# Patient Record
Sex: Female | Born: 1935 | Race: Black or African American | Hispanic: No | Smoking: Former smoker
Health system: Southern US, Community
[De-identification: ages and names within clinical notes are randomized; demographics above are authoritative.]

## PROBLEM LIST (undated history)

## (undated) DIAGNOSIS — H9110 Presbycusis, unspecified ear: Secondary | ICD-10-CM

## (undated) DIAGNOSIS — C3490 Malignant neoplasm of unspecified part of unspecified bronchus or lung: Secondary | ICD-10-CM

## (undated) DIAGNOSIS — E785 Hyperlipidemia, unspecified: Secondary | ICD-10-CM

## (undated) DIAGNOSIS — E669 Obesity, unspecified: Secondary | ICD-10-CM

## (undated) DIAGNOSIS — I219 Acute myocardial infarction, unspecified: Secondary | ICD-10-CM

## (undated) DIAGNOSIS — I10 Essential (primary) hypertension: Secondary | ICD-10-CM

## (undated) DIAGNOSIS — R0602 Shortness of breath: Secondary | ICD-10-CM

## (undated) DIAGNOSIS — C801 Malignant (primary) neoplasm, unspecified: Secondary | ICD-10-CM

## (undated) DIAGNOSIS — M1990 Unspecified osteoarthritis, unspecified site: Secondary | ICD-10-CM

## (undated) DIAGNOSIS — E039 Hypothyroidism, unspecified: Secondary | ICD-10-CM

## (undated) DIAGNOSIS — D649 Anemia, unspecified: Secondary | ICD-10-CM

## (undated) DIAGNOSIS — K219 Gastro-esophageal reflux disease without esophagitis: Secondary | ICD-10-CM

## (undated) DIAGNOSIS — I2510 Atherosclerotic heart disease of native coronary artery without angina pectoris: Secondary | ICD-10-CM

## (undated) DIAGNOSIS — G4700 Insomnia, unspecified: Secondary | ICD-10-CM

## (undated) DIAGNOSIS — R739 Hyperglycemia, unspecified: Secondary | ICD-10-CM

## (undated) HISTORY — PX: KNEE ARTHROPLASTY: SHX992

## (undated) HISTORY — PX: CORONARY STENT PLACEMENT: SHX1402

## (undated) HISTORY — PX: CHOLECYSTECTOMY: SHX55

---

## 2005-08-06 ENCOUNTER — Encounter: Payer: Self-pay | Admitting: Pulmonary Disease

## 2006-02-18 ENCOUNTER — Ambulatory Visit: Payer: Self-pay | Admitting: Pulmonary Disease

## 2006-02-25 ENCOUNTER — Ambulatory Visit: Payer: Self-pay | Admitting: Pulmonary Disease

## 2006-04-08 ENCOUNTER — Ambulatory Visit: Payer: Self-pay | Admitting: Pulmonary Disease

## 2006-04-12 ENCOUNTER — Ambulatory Visit (HOSPITAL_COMMUNITY): Admission: RE | Admit: 2006-04-12 | Discharge: 2006-04-12 | Payer: Self-pay | Admitting: Pulmonary Disease

## 2006-04-12 ENCOUNTER — Encounter: Payer: Self-pay | Admitting: Pulmonary Disease

## 2006-04-21 ENCOUNTER — Encounter: Payer: Self-pay | Admitting: Pulmonary Disease

## 2007-02-27 ENCOUNTER — Ambulatory Visit: Payer: Self-pay | Admitting: Pulmonary Disease

## 2007-02-27 LAB — CONVERTED CEMR LAB
CO2: 32 meq/L (ref 19–32)
Calcium: 9.6 mg/dL (ref 8.4–10.5)
Creatinine, Ser: 0.9 mg/dL (ref 0.4–1.2)
Glucose, Bld: 108 mg/dL — ABNORMAL HIGH (ref 70–99)
Sodium: 140 meq/L (ref 135–145)

## 2007-03-03 ENCOUNTER — Encounter: Payer: Self-pay | Admitting: Pulmonary Disease

## 2007-03-03 ENCOUNTER — Ambulatory Visit: Payer: Self-pay | Admitting: Cardiovascular Disease

## 2007-10-01 DIAGNOSIS — I10 Essential (primary) hypertension: Secondary | ICD-10-CM

## 2007-10-01 DIAGNOSIS — E669 Obesity, unspecified: Secondary | ICD-10-CM

## 2007-10-01 DIAGNOSIS — J984 Other disorders of lung: Secondary | ICD-10-CM

## 2007-10-01 HISTORY — DX: Obesity, unspecified: E66.9

## 2007-10-01 HISTORY — DX: Essential (primary) hypertension: I10

## 2007-10-24 ENCOUNTER — Emergency Department (HOSPITAL_COMMUNITY): Admission: EM | Admit: 2007-10-24 | Discharge: 2007-10-24 | Payer: Self-pay | Admitting: Emergency Medicine

## 2008-08-27 ENCOUNTER — Ambulatory Visit: Payer: Self-pay | Admitting: Vascular Surgery

## 2008-09-28 ENCOUNTER — Ambulatory Visit: Payer: Self-pay | Admitting: Vascular Surgery

## 2009-03-28 ENCOUNTER — Encounter: Payer: Self-pay | Admitting: Pulmonary Disease

## 2009-03-28 ENCOUNTER — Encounter: Admission: RE | Admit: 2009-03-28 | Discharge: 2009-03-28 | Payer: Self-pay | Admitting: *Deleted

## 2009-04-26 DIAGNOSIS — M1990 Unspecified osteoarthritis, unspecified site: Secondary | ICD-10-CM

## 2009-04-26 DIAGNOSIS — E785 Hyperlipidemia, unspecified: Secondary | ICD-10-CM

## 2009-04-26 DIAGNOSIS — E039 Hypothyroidism, unspecified: Secondary | ICD-10-CM | POA: Insufficient documentation

## 2009-04-26 DIAGNOSIS — I10 Essential (primary) hypertension: Secondary | ICD-10-CM | POA: Insufficient documentation

## 2009-04-26 HISTORY — DX: Hyperlipidemia, unspecified: E78.5

## 2009-04-26 HISTORY — DX: Hypothyroidism, unspecified: E03.9

## 2009-04-26 HISTORY — DX: Unspecified osteoarthritis, unspecified site: M19.90

## 2009-04-27 ENCOUNTER — Ambulatory Visit: Payer: Self-pay | Admitting: Pulmonary Disease

## 2009-05-10 ENCOUNTER — Encounter (INDEPENDENT_AMBULATORY_CARE_PROVIDER_SITE_OTHER): Payer: Self-pay | Admitting: Interventional Radiology

## 2009-05-10 ENCOUNTER — Ambulatory Visit (HOSPITAL_COMMUNITY): Admission: RE | Admit: 2009-05-10 | Discharge: 2009-05-10 | Payer: Self-pay | Admitting: Pulmonary Disease

## 2009-05-10 ENCOUNTER — Encounter: Payer: Self-pay | Admitting: Pulmonary Disease

## 2009-05-18 ENCOUNTER — Telehealth (INDEPENDENT_AMBULATORY_CARE_PROVIDER_SITE_OTHER): Payer: Self-pay | Admitting: *Deleted

## 2009-05-20 ENCOUNTER — Ambulatory Visit: Payer: Self-pay | Admitting: Pulmonary Disease

## 2009-05-20 ENCOUNTER — Telehealth: Payer: Self-pay | Admitting: Pulmonary Disease

## 2009-05-20 DIAGNOSIS — C3490 Malignant neoplasm of unspecified part of unspecified bronchus or lung: Secondary | ICD-10-CM | POA: Insufficient documentation

## 2009-05-20 HISTORY — DX: Malignant neoplasm of unspecified part of unspecified bronchus or lung: C34.90

## 2009-05-23 ENCOUNTER — Encounter: Payer: Self-pay | Admitting: Pulmonary Disease

## 2009-05-25 ENCOUNTER — Ambulatory Visit: Payer: Self-pay | Admitting: Internal Medicine

## 2009-05-25 ENCOUNTER — Ambulatory Visit (HOSPITAL_COMMUNITY): Admission: RE | Admit: 2009-05-25 | Discharge: 2009-05-25 | Payer: Self-pay | Admitting: Pulmonary Disease

## 2009-05-26 ENCOUNTER — Ambulatory Visit: Payer: Self-pay | Admitting: Internal Medicine

## 2009-06-02 ENCOUNTER — Ambulatory Visit (HOSPITAL_COMMUNITY): Admission: RE | Admit: 2009-06-02 | Discharge: 2009-06-02 | Payer: Self-pay | Admitting: Internal Medicine

## 2009-06-13 LAB — CBC WITH DIFFERENTIAL/PLATELET
BASO%: 0.7 % (ref 0.0–2.0)
Basophils Absolute: 0 10e3/uL (ref 0.0–0.1)
EOS%: 3.4 % (ref 0.0–7.0)
Eosinophils Absolute: 0.2 10e3/uL (ref 0.0–0.5)
HCT: 35.7 % (ref 34.8–46.6)
HGB: 11.7 g/dL (ref 11.6–15.9)
LYMPH%: 32.1 % (ref 14.0–49.7)
MCH: 28.1 pg (ref 25.1–34.0)
MCHC: 32.8 g/dL (ref 31.5–36.0)
MCV: 85.6 fL (ref 79.5–101.0)
MONO#: 0.4 10e3/uL (ref 0.1–0.9)
MONO%: 8.8 % (ref 0.0–14.0)
NEUT#: 2.8 10e3/uL (ref 1.5–6.5)
NEUT%: 55 % (ref 38.4–76.8)
Platelets: 232 10e3/uL (ref 145–400)
RBC: 4.17 10e6/uL (ref 3.70–5.45)
RDW: 15.6 % — ABNORMAL HIGH (ref 11.2–14.5)
WBC: 5.1 10e3/uL (ref 3.9–10.3)
lymph#: 1.6 10e3/uL (ref 0.9–3.3)

## 2009-06-13 LAB — COMPREHENSIVE METABOLIC PANEL
ALT: 18 U/L (ref 0–35)
AST: 17 U/L (ref 0–37)
Alkaline Phosphatase: 95 U/L (ref 39–117)
Calcium: 9.1 mg/dL (ref 8.4–10.5)
Total Bilirubin: 0.4 mg/dL (ref 0.3–1.2)

## 2009-09-07 ENCOUNTER — Ambulatory Visit: Payer: Self-pay | Admitting: Internal Medicine

## 2009-09-12 ENCOUNTER — Ambulatory Visit (HOSPITAL_COMMUNITY): Admission: RE | Admit: 2009-09-12 | Discharge: 2009-09-12 | Payer: Self-pay | Admitting: Internal Medicine

## 2009-09-12 LAB — CBC WITH DIFFERENTIAL/PLATELET
BASO%: 0.3 % (ref 0.0–2.0)
Basophils Absolute: 0 10*3/uL (ref 0.0–0.1)
EOS%: 1.7 % (ref 0.0–7.0)
Eosinophils Absolute: 0.1 10*3/uL (ref 0.0–0.5)
HCT: 37.2 % (ref 34.8–46.6)
HGB: 12.3 g/dL (ref 11.6–15.9)
LYMPH%: 16.4 % (ref 14.0–49.7)
MCH: 28.5 pg (ref 25.1–34.0)
MCHC: 33.1 g/dL (ref 31.5–36.0)
MCV: 86.3 fL (ref 79.5–101.0)
MONO#: 0.7 10*3/uL (ref 0.1–0.9)
MONO%: 8.8 % (ref 0.0–14.0)
NEUT#: 6 10*3/uL (ref 1.5–6.5)
NEUT%: 72.8 % (ref 38.4–76.8)
Platelets: 245 10*3/uL (ref 145–400)
RBC: 4.31 10*6/uL (ref 3.70–5.45)
RDW: 14.7 % — ABNORMAL HIGH (ref 11.2–14.5)
WBC: 8.3 10*3/uL (ref 3.9–10.3)
lymph#: 1.4 10*3/uL (ref 0.9–3.3)

## 2009-09-12 LAB — COMPREHENSIVE METABOLIC PANEL
AST: 21 U/L (ref 0–37)
Alkaline Phosphatase: 100 U/L (ref 39–117)
BUN: 23 mg/dL (ref 6–23)
Chloride: 107 mEq/L (ref 96–112)
Creatinine, Ser: 1.1 mg/dL (ref 0.40–1.20)
Glucose, Bld: 109 mg/dL — ABNORMAL HIGH (ref 70–99)
Potassium: 3 mEq/L — ABNORMAL LOW (ref 3.5–5.3)
Total Protein: 7.9 g/dL (ref 6.0–8.3)

## 2011-03-04 LAB — CREATININE, SERUM
GFR calc Af Amer: 45 mL/min — ABNORMAL LOW (ref >60)
GFR calc non Af Amer: 37 mL/min — ABNORMAL LOW (ref >60)

## 2011-03-05 LAB — CBC
MCHC: 33.6 g/dL (ref 30.0–36.0)
RDW: 14.5 % (ref 11.5–15.5)
WBC: 5.3 10*3/uL (ref 4.0–10.5)

## 2011-03-05 LAB — GLUCOSE, CAPILLARY: Glucose-Capillary: 98 mg/dL (ref 70–99)

## 2011-03-05 LAB — APTT: aPTT: 34 seconds (ref 24–37)

## 2011-04-10 NOTE — Consult Note (Signed)
VASCULAR SURGERY CONSULTATION   Shannon Camacho, Shannon Camacho  DOB:  September 06, 1928                                       09/28/2008  UEAVW#:[id number]   The patient was referred by Dr. Wynelle Cleveland for vascular surgery  evaluation of her lower extremities.  She is a 77 year old female who  complains of occasional discomfort in her heels with the right worse  than left.  She has had no history of nonhealing ulcers, infection and  denies any numbness in the feet.  She does not ambulate long distances  because of dyspnea on exertion.  She is not limited in her walking by  leg discomfort.  She has had a previous knee replacement which limits  her somewhat.   PAST MEDICAL HISTORY:  1. Hypertension.  2. History of a right lung mass - etiology unknown.  3. Negative for coronary artery disease, COPD, stroke or      hyperlipidemia.   PAST SURGICAL HISTORY:  1. Right knee replacement.  2. Cholecystectomy.  3. Tubal ligation.   FAMILY HISTORY:  Positive for myocardial infarction in her mother.  Negative for diabetes and stroke.  Her brother died of an unknown  cancer.   SOCIAL HISTORY:  The patient is widowed and retired.  She has not smoked  in close to 30 years.  Does not use alcohol.   REVIEW OF SYSTEMS:  Is positive for a heart murmur.  She denies any  chest pain but does have dyspnea on exertion.  Has occasional headaches,  arthritis, joint pain and muscle pain.   ALLERGIES:  None known.   MEDICATIONS:  Please see health history form.   PHYSICAL EXAMINATION:  Vital signs:  Blood pressure is 127/79, heart  rate is 92, respirations 22.  General:  She is an elderly female in no  apparent distress.  Alert and oriented x3.  Neck:  Supple, 3+ carotid  pulses palpable.  No bruits are audible.  Neurological:  Normal.  No  palpable adenopathy in the neck.  Chest:  Clear to auscultation.  Cardiovascular:  Regular rhythm.  No murmurs.  Abdomen:  Obese.  No  palpable masses.  She has 2+  femoral and 2+ popliteal pulses  bilaterally.  The left leg has a 2+ dorsalis pedis pulse, right leg a 1+  posterior tibial pulse.  Both feet are well-perfused without evidence of  ischemia.   Vascular lab studies in our office today revealed an ABI of 0.80 on the  right, 1.0 on the left, biphasic flow in the right posterior tibial and  triphasic flow in the left dorsalis pedis.   Her circulation is not diminished enough to be causing rest pain in her  feet and I do not think that the pain in her heels is vascular in  origin.  It may well be orthopedic type pain that is causing her  discomfort.  I do not think any further vascular workup is indicated at  this time.   Shannon Camacho, M.D.  Electronically Signed  JDL/MEDQ  D:  09/28/2008  T:  09/29/2008  Job:  [id number]   cc:   Tinnie Gens A. Petrinitz, D.P.M.

## 2012-12-03 ENCOUNTER — Encounter: Payer: Self-pay | Admitting: Family Medicine

## 2012-12-03 ENCOUNTER — Ambulatory Visit (INDEPENDENT_AMBULATORY_CARE_PROVIDER_SITE_OTHER): Payer: PRIVATE HEALTH INSURANCE | Admitting: Family Medicine

## 2012-12-03 VITALS — BP 124/75 | HR 67 | Temp 98.5°F | Ht 62.0 in | Wt 226.9 lb

## 2012-12-03 DIAGNOSIS — R7309 Other abnormal glucose: Secondary | ICD-10-CM

## 2012-12-03 DIAGNOSIS — G4700 Insomnia, unspecified: Secondary | ICD-10-CM | POA: Insufficient documentation

## 2012-12-03 DIAGNOSIS — M1990 Unspecified osteoarthritis, unspecified site: Secondary | ICD-10-CM

## 2012-12-03 DIAGNOSIS — E785 Hyperlipidemia, unspecified: Secondary | ICD-10-CM

## 2012-12-03 DIAGNOSIS — I2510 Atherosclerotic heart disease of native coronary artery without angina pectoris: Secondary | ICD-10-CM

## 2012-12-03 DIAGNOSIS — R739 Hyperglycemia, unspecified: Secondary | ICD-10-CM | POA: Insufficient documentation

## 2012-12-03 DIAGNOSIS — I10 Essential (primary) hypertension: Secondary | ICD-10-CM

## 2012-12-03 DIAGNOSIS — E039 Hypothyroidism, unspecified: Secondary | ICD-10-CM

## 2012-12-03 DIAGNOSIS — C3490 Malignant neoplasm of unspecified part of unspecified bronchus or lung: Secondary | ICD-10-CM

## 2012-12-03 HISTORY — DX: Atherosclerotic heart disease of native coronary artery without angina pectoris: I25.10

## 2012-12-03 HISTORY — DX: Insomnia, unspecified: G47.00

## 2012-12-03 HISTORY — DX: Hyperglycemia, unspecified: R73.9

## 2012-12-03 LAB — CBC
Platelets: 273 10*3/uL (ref 150–400)
RBC: 4.14 MIL/uL (ref 3.87–5.11)
RDW: 15 % (ref 11.5–15.5)
WBC: 5.2 10*3/uL (ref 4.0–10.5)

## 2012-12-03 MED ORDER — TRAZODONE HCL 50 MG PO TABS: 25.0000 mg | ORAL_TABLET | Freq: Every evening | ORAL | Status: DC | PRN

## 2012-12-03 MED ORDER — DICLOFENAC SODIUM 1 % TD GEL: 4.0000 g | Freq: Four times a day (QID) | TRANSDERMAL | Status: DC

## 2012-12-03 NOTE — Assessment & Plan Note (Signed)
Refill voltaren gel

## 2012-12-03 NOTE — Patient Instructions (Addendum)
Please use tylenol and tramadol for pain rather than the ibuprofen. I sent in a new prescription for insomnia I will call with blood work results. See me in 2-3 months to keep getting to know each other.

## 2012-12-04 LAB — COMPLETE METABOLIC PANEL WITH GFR
Albumin: 4.1 g/dL (ref 3.5–5.2)
BUN: 21 mg/dL (ref 6–23)
Calcium: 9.1 mg/dL (ref 8.4–10.5)
Chloride: 109 mEq/L (ref 96–112)
Creat: 0.91 mg/dL (ref 0.50–1.10)
GFR, Est African American: 68 mL/min
GFR, Est Non African American: 59 mL/min — ABNORMAL LOW
Glucose, Bld: 77 mg/dL (ref 70–99)
Potassium: 3.8 mEq/L (ref 3.5–5.3)

## 2012-12-05 ENCOUNTER — Telehealth: Payer: Self-pay | Admitting: Family Medicine

## 2012-12-05 MED ORDER — LEVOTHYROXINE SODIUM 88 MCG PO TABS: 88.0000 ug | ORAL_TABLET | Freq: Every day | ORAL | Status: DC

## 2012-12-05 NOTE — Assessment & Plan Note (Signed)
Previously treated with Alprazolam.  Will try trazodone.

## 2012-12-05 NOTE — Progress Notes (Signed)
  Subjective:    Patient ID: Shannon Camacho, female    DOB: 13-Apr-1928, 79 y.o.   MRN: [id number]  HPI  Doing well.  Has moved back to area and is establishing care.  No symptomatic complaints.  Most issues have to do with establishing referrals, refilling meds.    Review of Systems Denies CP, SOB, Cough, bleeding, change in bowel, bladder or wt     Objective:   Physical Exam Lungs clear Cardiac RRR with 1/6 SEM Abd benign Ext trace edema.       Assessment & Plan:

## 2012-12-05 NOTE — Assessment & Plan Note (Signed)
Had recent lipids in June which were OK on current meds

## 2012-12-05 NOTE — Assessment & Plan Note (Signed)
Well controled. 

## 2012-12-05 NOTE — Assessment & Plan Note (Signed)
Check TSH 

## 2012-12-05 NOTE — Assessment & Plan Note (Signed)
Referral back to Dr. Arville Go for ongoing lung cancer treatment surveillance.

## 2012-12-05 NOTE — Telephone Encounter (Signed)
Pt is needing referral to go see Dr Arbutus Ped [facility]

## 2012-12-10 NOTE — Telephone Encounter (Signed)
Referral has been placed in [facility] work queue.  They will call patient to schedule appointment.  Spoke with Daughter on Monday and informed her of this.  Ileana Ladd

## 2012-12-10 NOTE — Telephone Encounter (Signed)
Referral should be in the system.  Will route to our referral coordinator, Terese Door.

## 2012-12-11 ENCOUNTER — Telehealth: Payer: Self-pay | Admitting: Internal Medicine

## 2012-12-11 NOTE — Telephone Encounter (Signed)
S/W pt dtr in re NP appt 01/20 @ 1:30 w/Dr. Arbutus Ped Referring Dr. Doralee Albino Dx-Lung CA Welcome packet registration

## 2012-12-15 ENCOUNTER — Encounter: Payer: Self-pay | Admitting: Internal Medicine

## 2012-12-15 ENCOUNTER — Other Ambulatory Visit (HOSPITAL_BASED_OUTPATIENT_CLINIC_OR_DEPARTMENT_OTHER): Payer: PRIVATE HEALTH INSURANCE | Admitting: Lab

## 2012-12-15 ENCOUNTER — Ambulatory Visit: Payer: PRIVATE HEALTH INSURANCE

## 2012-12-15 ENCOUNTER — Telehealth: Payer: Self-pay | Admitting: Internal Medicine

## 2012-12-15 ENCOUNTER — Ambulatory Visit (HOSPITAL_BASED_OUTPATIENT_CLINIC_OR_DEPARTMENT_OTHER): Payer: Medicaid Other | Admitting: Internal Medicine

## 2012-12-15 VITALS — BP 142/79 | HR 62 | Temp 97.3°F | Resp 18 | Ht 62.0 in | Wt 223.3 lb

## 2012-12-15 DIAGNOSIS — C3410 Malignant neoplasm of upper lobe, unspecified bronchus or lung: Secondary | ICD-10-CM

## 2012-12-15 DIAGNOSIS — C3490 Malignant neoplasm of unspecified part of unspecified bronchus or lung: Secondary | ICD-10-CM

## 2012-12-15 LAB — CBC WITH DIFFERENTIAL/PLATELET
BASO%: 1 % (ref 0.0–2.0)
EOS%: 5.6 % (ref 0.0–7.0)
LYMPH%: 31.1 % (ref 14.0–49.7)
MCH: 28.4 pg (ref 25.1–34.0)
MCHC: 32.4 g/dL (ref 31.5–36.0)
MONO#: 0.5 10*3/uL (ref 0.1–0.9)
Platelets: 243 10*3/uL (ref 145–400)
RBC: 4.2 10*6/uL (ref 3.70–5.45)
WBC: 4.8 10*3/uL (ref 3.9–10.3)
lymph#: 1.5 10*3/uL (ref 0.9–3.3)

## 2012-12-15 LAB — COMPREHENSIVE METABOLIC PANEL (CC13)
ALT: 13 U/L (ref 0–55)
AST: 15 U/L (ref 5–34)
CO2: 28 mEq/L (ref 22–29)
Creatinine: 1.1 mg/dL (ref 0.6–1.1)
Sodium: 141 mEq/L (ref 136–145)
Total Bilirubin: 0.42 mg/dL (ref 0.20–1.20)
Total Protein: 7.8 g/dL (ref 6.4–8.3)

## 2012-12-15 NOTE — Progress Notes (Signed)
Checked in new patient. No financial issues. °

## 2012-12-15 NOTE — Progress Notes (Signed)
[facility] [phone]   [phone]  CONSULT NOTE  REFERRING PHYSICIAN: Dr. Larene Beach.  REASON FOR CONSULTATION:  79 years old [place] American female with lung cancer.  HPI Shannon Camacho is a 79 y.o. female was past medical history significant for hypertension, coronary artery disease status post 5 stent placement in 2011 as well as remote history of smoking. The patient was diagnosed with stage IIIB/4 non-small cell lung cancer, adenocarcinoma with negative EGFR mutation in June of 2010. She was considered at that time for treatment with chemotherapy in the form of carboplatin and Alimta versus palliative care. The patient decided at that time to move to [place] to be close to other family members in that area. She was seen by medical oncology at the [facility]. She was followed by observation for more than 2 years as the patient refused any form of treatment at that time. She had molecular profiling of her tumor that showed positive BRAF mutation (V600E). She was considered for treatment with zelboraf. She declined the treatment initially but in June of 2013 she decided to proceed with the treatment after she was found to have significant evidence for disease progression. She took less than 2 weeks of treatment and this was discontinued secondary to intolerance and adverse effect including skin rash and hand-foot syndrome. Her last CT scan of the chest was performed on 05/05/2012 and it showed interval progression of disease as manifested by coalescence of tissue previously described right upper lobe masses and to a single mass which measured greater than the some of the 2 previously seen masses and it measured 8.8 x 3.3 CM, at least a modest increase in size of several other nodules and to a lesser degree of increase in the conspicuity of smaller nodules. No change in the mediastinal adenopathy. The patient decided to return back to  [place] to be close to her daughter Shannon Camacho. She came today for reevaluation and to reestablish care with me. During her stay in [place] the patient was diagnosed with coronary artery disease and she had 5 stents placed. She is feeling fine today except for mild fatigue and shortness breath with exertion. She denied having any significant weight loss or night sweats. She has no chest pain, cough or hemoptysis.  PAST MEDICAL HISTORY: 1) hypertension 2) Coronary artery disease status post 5 stent placement 3) history of heart murmur 4) status post cholecystectomy 5) status post tubal ligation 6) status post right knee replacement  FAMILY HISTORY: Brother had cancer of unknown type, father had peripheral vascular disease and mother with myocardial infarction.   SOCIAL HISTORY: The patient is a widow and has 5 children, 4 living. She has a history of smoking less than one pack per day for around 25 years and quit more than 30 years ago. No history of alcohol or drug abuse. She was accompanied by her daughter Shannon Camacho today.  No Known Allergies  Current Outpatient Prescriptions  Medication Sig Dispense Refill  . aspirin 81 MG tablet Take 81 mg by mouth daily.      . ciclopirox (LOPROX) 0.77 % cream Apply 1 application topically daily. Uses for feet.      . diclofenac sodium (VOLTAREN) 1 % GEL Apply 4 g topically 4 (four) times daily. Uses on knees  100 g  12  . hydrochlorothiazide (MICROZIDE) 12.5 MG capsule Take 12.5 mg by mouth daily.      Marland Kitchen levothyroxine (SYNTHROID, LEVOTHROID) 88 MCG tablet  Take 1 tablet (88 mcg total) by mouth daily.  90 tablet  3  . metoprolol tartrate (LOPRESSOR) 25 MG tablet Take 25 mg by mouth 2 (two) times daily.      . pravastatin (PRAVACHOL) 40 MG tablet Take 40 mg by mouth daily. Take 1 1/2 tablet by mouth once daily      . traMADol (ULTRAM) 50 MG tablet Take 50 mg by mouth every 6 (six) hours as needed.      . traZODone (DESYREL) 50 MG tablet Take 0.5-1  tablets (25-50 mg total) by mouth at bedtime as needed for sleep.  30 tablet  3    Review of Systems  A comprehensive review of systems was negative except for: Constitutional: positive for fatigue Respiratory: positive for dyspnea on exertion  Physical Exam  HQI:ONGEX, healthy, no distress, well nourished and well developed SKIN: skin color, texture, turgor are normal HEAD: Normocephalic, No masses, lesions, tenderness or abnormalities EYES: normal EARS: External ears normal OROPHARYNX:no exudate and no erythema  NECK: supple, no adenopathy LYMPH:  no palpable lymphadenopathy, no hepatosplenomegaly BREAST:not examined LUNGS: clear to auscultation  HEART: regular rate & rhythm, no murmurs and no gallops ABDOMEN:abdomen soft, non-tender, obese, normal bowel sounds and no masses or organomegaly BACK: Back symmetric, no curvature. EXTREMITIES:no joint deformities, effusion, or inflammation, no edema, no skin discoloration, no clubbing  NEURO: alert & oriented x 3 with fluent speech, no focal motor/sensory deficits  PERFORMANCE STATUS: ECOG 1  LABORATORY DATA: Lab Results  Component Value Date   WBC 4.8 12/15/2012   HGB 11.9 12/15/2012   HCT 36.7 12/15/2012   MCV 87.5 12/15/2012   PLT 243 12/15/2012      Chemistry      Component Value Date/Time   NA 141 12/15/2012 1328   NA 143 12/03/2012 1653   K 3.8 12/15/2012 1328   K 3.8 12/03/2012 1653   CL 104 12/15/2012 1328   CL 109 12/03/2012 1653   CO2 28 12/15/2012 1328   CO2 27 12/03/2012 1653   BUN 17.0 12/15/2012 1328   BUN 21 12/03/2012 1653   CREATININE 1.1 12/15/2012 1328   CREATININE 0.91 12/03/2012 1653   CREATININE 1.10 09/12/2009 1316      Component Value Date/Time   CALCIUM 9.3 12/15/2012 1328   CALCIUM 9.1 12/03/2012 1653   ALKPHOS 108 12/15/2012 1328   ALKPHOS 86 12/03/2012 1653   AST 15 12/15/2012 1328   AST 20 12/03/2012 1653   ALT 13 12/15/2012 1328   ALT 15 12/03/2012 1653   BILITOT 0.42 12/15/2012 1328   BILITOT 0.3 12/03/2012 1653        RADIOGRAPHIC STUDIES: No results found.  ASSESSMENT: This is a very pleasant 79 years old African American female diagnosed with metastatic non-small cell lung cancer, adenocarcinoma with negative EGFR mutation but positive BRAF (V600E) mutation. The patient did not receive any previous treatment except for less than 2 weeks with zelboraf discontinued secondary to intolerance in July of 2013.   PLAN: I have a lengthy discussion with the patient and her daughter today about her condition. Her last scan was performed in June of 2013. I recommended for the patient to have repeat CT scan of the head, chest, abdomen and pelvis for restaging of her disease. I will try to get a copy of the previous scan done in [place] for comparison. I would see the patient back for followup visit in 2 weeks for evaluation and discussion of her scan results and treatment options.  She was advised to call me immediately if she has any concerning symptoms in the interval.  All questions were answered. The patient knows to call the clinic with any problems, questions or concerns. We can certainly see the patient much sooner if necessary.  Thank you so much for allowing me to participate in the care of Shannon Camacho. I will continue to follow up the patient with you and assist in her care.  I spent 30 minutes counseling the patient face to face. The total time spent in the appointment was 55 minutes.   Rashunda Passon K. 12/15/2012, 2:40 PM

## 2012-12-15 NOTE — Telephone Encounter (Signed)
appt made and printed for pt aom °

## 2012-12-15 NOTE — Patient Instructions (Signed)
You have progressive lung cancer. We will repeat staging workup. Followup in 2 weeks

## 2012-12-25 ENCOUNTER — Ambulatory Visit (HOSPITAL_COMMUNITY)
Admission: RE | Admit: 2012-12-25 | Discharge: 2012-12-25 | Disposition: A | Discharge status: Home / Self Care | Payer: PRIVATE HEALTH INSURANCE | Source: Ambulatory Visit | Attending: Internal Medicine | Admitting: Internal Medicine

## 2012-12-25 ENCOUNTER — Encounter (HOSPITAL_COMMUNITY): Payer: Self-pay

## 2012-12-25 DIAGNOSIS — R05 Cough: Secondary | ICD-10-CM | POA: Insufficient documentation

## 2012-12-25 DIAGNOSIS — G319 Degenerative disease of nervous system, unspecified: Secondary | ICD-10-CM | POA: Insufficient documentation

## 2012-12-25 DIAGNOSIS — R222 Localized swelling, mass and lump, trunk: Secondary | ICD-10-CM | POA: Insufficient documentation

## 2012-12-25 DIAGNOSIS — R059 Cough, unspecified: Secondary | ICD-10-CM | POA: Insufficient documentation

## 2012-12-25 DIAGNOSIS — E279 Disorder of adrenal gland, unspecified: Secondary | ICD-10-CM | POA: Insufficient documentation

## 2012-12-25 DIAGNOSIS — I6529 Occlusion and stenosis of unspecified carotid artery: Secondary | ICD-10-CM | POA: Insufficient documentation

## 2012-12-25 DIAGNOSIS — N289 Disorder of kidney and ureter, unspecified: Secondary | ICD-10-CM | POA: Insufficient documentation

## 2012-12-25 DIAGNOSIS — C3490 Malignant neoplasm of unspecified part of unspecified bronchus or lung: Secondary | ICD-10-CM | POA: Insufficient documentation

## 2012-12-25 DIAGNOSIS — I658 Occlusion and stenosis of other precerebral arteries: Secondary | ICD-10-CM | POA: Insufficient documentation

## 2012-12-25 DIAGNOSIS — Z9089 Acquired absence of other organs: Secondary | ICD-10-CM | POA: Insufficient documentation

## 2012-12-25 DIAGNOSIS — R599 Enlarged lymph nodes, unspecified: Secondary | ICD-10-CM | POA: Insufficient documentation

## 2012-12-25 DIAGNOSIS — K429 Umbilical hernia without obstruction or gangrene: Secondary | ICD-10-CM | POA: Insufficient documentation

## 2012-12-25 HISTORY — DX: Malignant (primary) neoplasm, unspecified: C80.1

## 2012-12-25 HISTORY — DX: Essential (primary) hypertension: I10

## 2012-12-25 MED ORDER — IOHEXOL 300 MG/ML  SOLN
100.0000 mL | Freq: Once | INTRAMUSCULAR | Status: AC | PRN
  Administered 2012-12-25: 100 mL via INTRAVENOUS

## 2012-12-29 ENCOUNTER — Other Ambulatory Visit: Payer: Self-pay | Admitting: Lab

## 2012-12-29 ENCOUNTER — Encounter: Payer: Self-pay | Admitting: Internal Medicine

## 2012-12-29 ENCOUNTER — Other Ambulatory Visit (HOSPITAL_BASED_OUTPATIENT_CLINIC_OR_DEPARTMENT_OTHER): Payer: PRIVATE HEALTH INSURANCE | Admitting: Lab

## 2012-12-29 ENCOUNTER — Telehealth: Payer: Self-pay | Admitting: *Deleted

## 2012-12-29 ENCOUNTER — Telehealth: Payer: Self-pay | Admitting: Internal Medicine

## 2012-12-29 ENCOUNTER — Ambulatory Visit (HOSPITAL_BASED_OUTPATIENT_CLINIC_OR_DEPARTMENT_OTHER): Payer: PRIVATE HEALTH INSURANCE | Admitting: Internal Medicine

## 2012-12-29 VITALS — BP 132/83 | HR 80 | Temp 97.2°F | Resp 20 | Ht 62.0 in | Wt 225.6 lb

## 2012-12-29 DIAGNOSIS — C3490 Malignant neoplasm of unspecified part of unspecified bronchus or lung: Secondary | ICD-10-CM

## 2012-12-29 DIAGNOSIS — C3410 Malignant neoplasm of upper lobe, unspecified bronchus or lung: Secondary | ICD-10-CM

## 2012-12-29 DIAGNOSIS — E538 Deficiency of other specified B group vitamins: Secondary | ICD-10-CM

## 2012-12-29 LAB — CBC WITH DIFFERENTIAL/PLATELET
Eosinophils Absolute: 0.3 10*3/uL (ref 0.0–0.5)
HCT: 35.9 % (ref 34.8–46.6)
LYMPH%: 31.2 % (ref 14.0–49.7)
MONO#: 0.5 10*3/uL (ref 0.1–0.9)
NEUT#: 2.7 10*3/uL (ref 1.5–6.5)
Platelets: 237 10*3/uL (ref 145–400)
RBC: 4.15 10*6/uL (ref 3.70–5.45)
WBC: 5.2 10*3/uL (ref 3.9–10.3)

## 2012-12-29 LAB — COMPREHENSIVE METABOLIC PANEL (CC13)
CO2: 24 mEq/L (ref 22–29)
Creatinine: 1.1 mg/dL (ref 0.6–1.1)
Glucose: 91 mg/dl (ref 70–99)
Total Bilirubin: 0.32 mg/dL (ref 0.20–1.20)
Total Protein: 7.7 g/dL (ref 6.4–8.3)

## 2012-12-29 MED ORDER — CYANOCOBALAMIN 1000 MCG/ML IJ SOLN
1000.0000 ug | Freq: Once | INTRAMUSCULAR | Status: AC
  Administered 2012-12-29: 1000 ug via INTRAMUSCULAR

## 2012-12-29 MED ORDER — PROCHLORPERAZINE MALEATE 10 MG PO TABS: 10.0000 mg | ORAL_TABLET | Freq: Four times a day (QID) | ORAL | Status: DC | PRN

## 2012-12-29 MED ORDER — DEXAMETHASONE 4 MG PO TABS: ORAL_TABLET | ORAL | Status: DC

## 2012-12-29 MED ORDER — FOLIC ACID 1 MG PO TABS: 1.0000 mg | ORAL_TABLET | Freq: Every day | ORAL | Status: DC

## 2012-12-29 MED ORDER — CYANOCOBALAMIN 1000 MCG/ML IJ SOLN: 1000.0000 ug | Freq: Once | INTRAMUSCULAR | Status: DC

## 2012-12-29 NOTE — Patient Instructions (Signed)
The recent scan showed evidence for disease progression. We discussed treatment options including systemic chemotherapy with carboplatin and Alimta. First cycle expected next week.

## 2012-12-29 NOTE — Telephone Encounter (Signed)
Per staff message and POF I have scheduled appts.  JMW  

## 2012-12-29 NOTE — Progress Notes (Signed)
[facility] [phone]   Fax:([phone]) [phone]  OFFICE PROGRESS NOTE  Sanjuana Letters, MD [address] [place] [address]  DIAGNOSIS: Metastatic non-small cell lung cancer, adenocarcinoma with negative EGFR mutation and positive BRAF mutation, diagnosed  in June of 2010.   PRIOR THERAPY: Status post treatment with zelboraf 960 mg by mouth daily for less than 2 weeks at [facility] in [facility] in June of 2013 discontinued secondary to intolerance.  CURRENT THERAPY: The patient is expected to start next week the first cycle of systemic chemotherapy with carboplatin for AUC of 5 and Alimta 500 mg/M2 every 3 weeks.   INTERVAL HISTORY: Renlee Floor 79 y.o. female returns to the clinic today for routine followup visit accompanied her daughter. The patient is feeling fine today with no specific complaints except for occasional cramps in her fingers. The patient denied having any significant chest pain but continues to have shortness breath with exertion, no cough or hemoptysis. She denied having any significant weight loss or night sweats. She had repeat CT scan of the head, chest, abdomen and pelvis performed recently and she is here today for evaluation and discussion of her scan results.  MEDICAL HISTORY: Past Medical History  Diagnosis Date  . Hypertension   . lung ca dx'd 04/2009    ALLERGIES:   has no known allergies.  MEDICATIONS:  Current Outpatient Prescriptions  Medication Sig Dispense Refill  . aspirin 81 MG tablet Take 81 mg by mouth daily.      . ciclopirox (LOPROX) 0.77 % cream Apply 1 application topically daily. Uses for feet.      . diclofenac sodium (VOLTAREN) 1 % GEL Apply 4 g topically 4 (four) times daily. Uses on knees  100 g  12  . hydrochlorothiazide (MICROZIDE) 12.5 MG capsule Take 12.5 mg by mouth daily.      Marland Kitchen levothyroxine (SYNTHROID, LEVOTHROID) 88 MCG tablet Take 1 tablet (88  mcg total) by mouth daily.  90 tablet  3  . metoprolol tartrate (LOPRESSOR) 25 MG tablet Take 25 mg by mouth 2 (two) times daily.      . pravastatin (PRAVACHOL) 40 MG tablet Take 40 mg by mouth daily. Take 1 1/2 tablet by mouth once daily      . traMADol (ULTRAM) 50 MG tablet Take 50 mg by mouth every 6 (six) hours as needed.      . traZODone (DESYREL) 50 MG tablet Take 0.5-1 tablets (25-50 mg total) by mouth at bedtime as needed for sleep.  30 tablet  3    REVIEW OF SYSTEMS:  A comprehensive review of systems was negative except for: Respiratory: positive for dyspnea on exertion   PHYSICAL EXAMINATION: General appearance: alert, cooperative and no distress Head: Normocephalic, without obvious abnormality, atraumatic Neck: no adenopathy Lymph nodes: Cervical, supraclavicular, and axillary nodes normal. Resp: clear to auscultation bilaterally Cardio: regular rate and rhythm, S1, S2 normal, no murmur, click, rub or gallop GI: soft, non-tender; bowel sounds normal; no masses,  no organomegaly Extremities: extremities normal, atraumatic, no cyanosis or edema Neurologic: Alert and oriented X 3, normal strength and tone. Normal symmetric reflexes. Normal coordination and gait  ECOG PERFORMANCE STATUS: 1 - Symptomatic but completely ambulatory  Blood pressure 132/83, pulse 80, temperature 97.2 F (36.2 C), temperature source Oral, resp. rate 20, height 5\' 2"  (1.575 m), weight 225 lb 9.6 oz (102.331 kg).  LABORATORY DATA: Lab Results  Component Value Date  WBC 5.2 12/29/2012   HGB 11.6 12/29/2012   HCT 35.9 12/29/2012   MCV 86.5 12/29/2012   PLT 237 12/29/2012      Chemistry      Component Value Date/Time   NA 141 12/15/2012 1328   NA 143 12/03/2012 1653   K 3.8 12/15/2012 1328   K 3.8 12/03/2012 1653   CL 104 12/15/2012 1328   CL 109 12/03/2012 1653   CO2 28 12/15/2012 1328   CO2 27 12/03/2012 1653   BUN 17.0 12/15/2012 1328   BUN 21 12/03/2012 1653   CREATININE 1.1 12/15/2012 1328   CREATININE 0.91  12/03/2012 1653   CREATININE 1.10 09/12/2009 1316      Component Value Date/Time   CALCIUM 9.3 12/15/2012 1328   CALCIUM 9.1 12/03/2012 1653   ALKPHOS 108 12/15/2012 1328   ALKPHOS 86 12/03/2012 1653   AST 15 12/15/2012 1328   AST 20 12/03/2012 1653   ALT 13 12/15/2012 1328   ALT 15 12/03/2012 1653   BILITOT 0.42 12/15/2012 1328   BILITOT 0.3 12/03/2012 1653       RADIOGRAPHIC STUDIES: Ct Head W Wo Contrast  12/25/2012  *RADIOLOGY REPORT*  Clinical Data: Lung cancer.  CT HEAD WITHOUT AND WITH CONTRAST  Technique:  Contiguous axial images were obtained from the base of the skull through the vertex without and with intravenous contrast.  Contrast: OMNIPAQUE IOHEXOL 300 MG/ML  SOLN  Comparison: MRI brain without and with contrast 06/02/2009.  Findings: No acute cortical infarct, hemorrhage, or mass lesion is present.  The postcontrast images demonstrate no pathologic enhancement to suggest metastatic disease of the brain.  Moderate atrophy and white matter disease is stable.  The ventricles are proportionate to the degree of atrophy.  No significant extra-axial fluid collection is present.  The paranasal sinuses and mastoid air cells are clear.  The osseous skull is intact.  Atherosclerotic calcifications are noted in the cavernous carotid arteries and at the dural margin of the vertebral arteries bilaterally.  IMPRESSION:  1.  Stable atrophy diffuse white matter disease. 2.  No evidence for metastatic disease of the brain. 3.  No acute intracranial abnormality.   Original Report Authenticated By: Marin Roberts, M.D.    Ct Chest W Contrast  12/25/2012  *RADIOLOGY REPORT*  Clinical Data:  History of lung cancer.  Cough.  CT CHEST, ABDOMEN AND PELVIS WITH CONTRAST  Technique:  Multidetector CT imaging of the chest, abdomen and pelvis was performed following the standard protocol during bolus administration of intravenous contrast.  Contrast: OMNIPAQUE IOHEXOL 300 MG/ML  SOLN  Comparison:  CT  chest, abdomen and pelvis 09/12/2009.   CT CHEST  Findings:  There is new lymphadenopathy in the chest.  Index right paratracheal node which had measured 1 cm short axis dimension today measures 1.7 cm on image 18.  Right precarinal node which had measured 0.3 cm short axis dimension today measures 1.6 cm on image 19.  Subcarinal lymph node which had measured 0.9 cm short axis dimension today measures 1.8 cm on image 24.  There is a new small right pleural effusion.  No left pleural effusion or pericardial effusion is identified.  Lungs demonstrate a large right upper lobe mass measuring 7.2 x 6.1 cm on image 19.  Extensive interlobular septal thickening in the right upper lobe is compatible with lymphangitic tumor spread.  Two new right upper lobe pulmonary nodules are identified on image 22 each measuring 0.4 the centimeters in diameter.  There are  also new pulmonary nodules on the left.  A left upper lobe nodule on image 30 measures 0.5 cm short axis dimension.  Left lower lobe nodule on image 42 measures 1.7 x 1.1 cm.  Additionally, an index right lower lobe nodule on image 36 measures 0.6 cm. No focal bony lesion is identified.  IMPRESSION: Findings consistent with marked interval progression of lung carcinoma with a large right upper lobe mass, lymphangitic tumor spread in the right upper lobe, multiple pulmonary nodules and lymphadenopathy as described above.   CT ABDOMEN AND PELVIS  Findings:  Left adrenal nodule compatible with a benign adenoma is not markedly changed.  The right adrenal gland, spleen, pancreas and liver are unremarkable.  The patient is status post cholecystectomy.  Small low attenuating lesions in the left kidney are unchanged in compatible with cysts.  The right kidney appears normal.  Small fat containing umbilical hernia is identified. There is no lymphadenopathy or fluid.  Uterus, adnexa and urinary bladder appear normal.  The appendix, stomach and small and large bowel appear  normal.  No worrisome bony lesion is seen with some degenerative change about the hips and lower lumbar spine noted.  IMPRESSION:  Negative for metastatic or acute disease.   Original Report Authenticated By: Holley Dexter, M.D.     ASSESSMENT: This is a very pleasant 79 years old African American female with metastatic non-small cell lung cancer, adenocarcinoma with negative EGFR mutation and positive BRAF mutation. The patient has evidence for disease progression on his recent scan compared to her initial scan in 2010 with enlarging right upper lobe mass as well as bilateral pulmonary nodules and questionable lymphangitic spread of the tumor.   PLAN: I have a lengthy discussion with the patient and her daughter today about her disease stage, prognosis and treatment options. I recommended for the patient's systemic chemotherapy with carboplatin for AUC of 5 and Alimta 500 mg/M2 every 3 weeks. I also gave her the option of treatment with zelboraf at a reduced dose but the patient declined this option. I discussed with the patient the adverse effect of the chemotherapy including but not limited to alopecia, myelosuppression, nausea and vomiting, peripheral neuropathy, liver or renal dysfunction. The patient would receive vitamin B12 injection today. I would also: Her pharmacy was prescription for Compazine 10 mg by mouth every 6 hours as needed for nausea, folic acid 1 mg by mouth daily and Decadron 4 mg by mouth twice a day the day before, day of and day after the chemotherapy every 3 weeks.  She would have a chemotherapy education class this week before starting the first cycle of her chemotherapy next week. The patient come back for followup visit in 2 weeks for evaluation and management any adverse effect of her chemotherapy.  All questions were answered. The patient knows to call the clinic with any problems, questions or concerns. We can certainly see the patient much sooner if necessary.  I  spent 20 minutes counseling the patient face to face. The total time spent in the appointment was 30 minutes.

## 2012-12-30 ENCOUNTER — Encounter: Payer: Self-pay | Admitting: *Deleted

## 2012-12-30 ENCOUNTER — Other Ambulatory Visit: Payer: PRIVATE HEALTH INSURANCE

## 2012-12-31 ENCOUNTER — Telehealth: Payer: Self-pay | Admitting: *Deleted

## 2012-12-31 NOTE — Telephone Encounter (Signed)
Shannon Camacho called wanting to know whether it was okay for pt to take tylenol for her hands while taking chemo.  Called and left msg with Shannon Camacho regarding what dose she is taking and how frequently so we can check with Dr Donnald Garre whether it is okay for her to take.

## 2013-01-01 ENCOUNTER — Telehealth: Payer: Self-pay | Admitting: Medical Oncology

## 2013-01-01 NOTE — Telephone Encounter (Signed)
Daughter called to ask if it is okay for pt to take tylenol 325 mg prn. I told her it was okay

## 2013-01-05 ENCOUNTER — Ambulatory Visit (HOSPITAL_BASED_OUTPATIENT_CLINIC_OR_DEPARTMENT_OTHER): Payer: PRIVATE HEALTH INSURANCE

## 2013-01-05 ENCOUNTER — Other Ambulatory Visit (HOSPITAL_BASED_OUTPATIENT_CLINIC_OR_DEPARTMENT_OTHER): Payer: PRIVATE HEALTH INSURANCE | Admitting: Lab

## 2013-01-05 DIAGNOSIS — C3410 Malignant neoplasm of upper lobe, unspecified bronchus or lung: Secondary | ICD-10-CM

## 2013-01-05 DIAGNOSIS — C3490 Malignant neoplasm of unspecified part of unspecified bronchus or lung: Secondary | ICD-10-CM

## 2013-01-05 DIAGNOSIS — Z5111 Encounter for antineoplastic chemotherapy: Secondary | ICD-10-CM

## 2013-01-05 LAB — CBC WITH DIFFERENTIAL/PLATELET
Basophils Absolute: 0 10*3/uL (ref 0.0–0.1)
EOS%: 0 % (ref 0.0–7.0)
Eosinophils Absolute: 0 10*3/uL (ref 0.0–0.5)
HCT: 37.1 % (ref 34.8–46.6)
HGB: 12 g/dL (ref 11.6–15.9)
MCH: 27.9 pg (ref 25.1–34.0)
MCV: 86.3 fL (ref 79.5–101.0)
NEUT#: 11.3 10*3/uL — ABNORMAL HIGH (ref 1.5–6.5)
NEUT%: 86.4 % — ABNORMAL HIGH (ref 38.4–76.8)
lymph#: 1.1 10*3/uL (ref 0.9–3.3)

## 2013-01-05 LAB — COMPREHENSIVE METABOLIC PANEL (CC13)
AST: 16 U/L (ref 5–34)
Albumin: 3.9 g/dL (ref 3.5–5.0)
BUN: 26.8 mg/dL — ABNORMAL HIGH (ref 7.0–26.0)
Calcium: 9.5 mg/dL (ref 8.4–10.4)
Chloride: 107 mEq/L (ref 98–107)
Creatinine: 1 mg/dL (ref 0.6–1.1)
Glucose: 114 mg/dl — ABNORMAL HIGH (ref 70–99)

## 2013-01-05 MED ORDER — ONDANSETRON 16 MG/50ML IVPB (CHCC)
16.0000 mg | Freq: Once | INTRAVENOUS | Status: AC
  Administered 2013-01-05: 16 mg via INTRAVENOUS

## 2013-01-05 MED ORDER — PEMETREXED DISODIUM CHEMO INJECTION 500 MG
500.0000 mg/m2 | Freq: Once | INTRAVENOUS | Status: AC
  Administered 2013-01-05: 1050 mg via INTRAVENOUS
  Filled 2013-01-05: qty 42

## 2013-01-05 MED ORDER — SODIUM CHLORIDE 0.9 % IV SOLN
443.5000 mg | Freq: Once | INTRAVENOUS | Status: AC
  Administered 2013-01-05: 440 mg via INTRAVENOUS
  Filled 2013-01-05: qty 44

## 2013-01-05 MED ORDER — SODIUM CHLORIDE 0.9 % IV SOLN
Freq: Once | INTRAVENOUS | Status: AC
  Administered 2013-01-05: 16:00:00 via INTRAVENOUS

## 2013-01-05 MED ORDER — DEXAMETHASONE SODIUM PHOSPHATE 4 MG/ML IJ SOLN
20.0000 mg | Freq: Once | INTRAMUSCULAR | Status: AC
  Administered 2013-01-05: 20 mg via INTRAVENOUS

## 2013-01-05 NOTE — Patient Instructions (Signed)
[facility] Discharge Instructions for Patients Receiving Chemotherapy  Today you received the following chemotherapy agents: Alimta and Carboplatin. To help prevent nausea and vomiting after your treatment, we encourage you to take your nausea medication, Compazine. Take it every six hours as needed for nausea.   If you develop nausea and vomiting that is not controlled by your nausea medication, call the clinic. If it is after clinic hours your family physician or the after hours number for the clinic or go to the Emergency Department.   BELOW ARE SYMPTOMS THAT SHOULD BE REPORTED IMMEDIATELY:  *FEVER GREATER THAN 100.5 F  *CHILLS WITH OR WITHOUT FEVER  NAUSEA AND VOMITING THAT IS NOT CONTROLLED WITH YOUR NAUSEA MEDICATION  *UNUSUAL SHORTNESS OF BREATH  *UNUSUAL BRUISING OR BLEEDING  TENDERNESS IN MOUTH AND THROAT WITH OR WITHOUT PRESENCE OF ULCERS  *URINARY PROBLEMS  *BOWEL PROBLEMS  UNUSUAL RASH Items with * indicate a potential emergency and should be followed up as soon as possible.  One of the nurses will contact you 24 hours after your treatment. Please let the nurse know about any problems that you may have experienced. Feel free to call the clinic you have any questions or concerns. The clinic phone number is [phone].   I have been informed and understand all the instructions given to me. I know to contact the clinic, my physician, or go to the Emergency Department if any problems should occur. I do not have any questions at this time, but understand that I may call the clinic during office hours   should I have any questions or need assistance in obtaining follow up care.

## 2013-01-06 ENCOUNTER — Telehealth: Payer: Self-pay | Admitting: *Deleted

## 2013-01-06 ENCOUNTER — Other Ambulatory Visit: Payer: Self-pay | Admitting: Certified Registered Nurse Anesthetist

## 2013-01-06 NOTE — Telephone Encounter (Signed)
Message copied by Augusto Garbe on Tue Jan 06, 2013  4:44 PM ------      Message from: Charma Igo      Created: Mon Jan 05, 2013  3:32 PM      Regarding: chemo follow up call      Contact: [phone]       Alimta Carbo 01/[birth year] (or daughter Annice Pih [phone]) ------

## 2013-01-06 NOTE — Telephone Encounter (Signed)
Patient says she is doing well.  "My lower stomach feels funny and I have lower back pain.  I took stool softeners last night and had a bm today."  Reports eating and drinking well.  No problems with bowel or bladder.  Denies questions.  Encouraged to call if any changes.

## 2013-01-12 ENCOUNTER — Other Ambulatory Visit (HOSPITAL_BASED_OUTPATIENT_CLINIC_OR_DEPARTMENT_OTHER): Payer: PRIVATE HEALTH INSURANCE | Admitting: Lab

## 2013-01-12 DIAGNOSIS — C3410 Malignant neoplasm of upper lobe, unspecified bronchus or lung: Secondary | ICD-10-CM

## 2013-01-12 DIAGNOSIS — C3490 Malignant neoplasm of unspecified part of unspecified bronchus or lung: Secondary | ICD-10-CM

## 2013-01-12 LAB — COMPREHENSIVE METABOLIC PANEL (CC13)
ALT: 30 U/L (ref 0–55)
AST: 22 U/L (ref 5–34)
Albumin: 3.5 g/dL (ref 3.5–5.0)
Alkaline Phosphatase: 96 U/L (ref 40–150)
BUN: 20.6 mg/dL (ref 7.0–26.0)
CO2: 29 mEq/L (ref 22–29)
Calcium: 9.5 mg/dL (ref 8.4–10.4)
Chloride: 103 mEq/L (ref 98–107)
Creatinine: 1.1 mg/dL (ref 0.6–1.1)
Glucose: 109 mg/dl — ABNORMAL HIGH (ref 70–99)
Potassium: 3.5 mEq/L (ref 3.5–5.1)
Sodium: 140 mEq/L (ref 136–145)
Total Bilirubin: 0.74 mg/dL (ref 0.20–1.20)
Total Protein: 7.3 g/dL (ref 6.4–8.3)

## 2013-01-12 LAB — CBC WITH DIFFERENTIAL/PLATELET
Basophils Absolute: 0 10*3/uL (ref 0.0–0.1)
Eosinophils Absolute: 0.4 10*3/uL (ref 0.0–0.5)
HGB: 12 g/dL (ref 11.6–15.9)
LYMPH%: 33.4 % (ref 14.0–49.7)
MCV: 86.6 fL (ref 79.5–101.0)
MONO%: 8.3 % (ref 0.0–14.0)
NEUT#: 1.7 10*3/uL (ref 1.5–6.5)
NEUT%: 47.1 % (ref 38.4–76.8)
Platelets: 208 10*3/uL (ref 145–400)
RBC: 4.24 10*6/uL (ref 3.70–5.45)

## 2013-01-20 ENCOUNTER — Encounter: Payer: Self-pay | Admitting: Physician Assistant

## 2013-01-20 ENCOUNTER — Ambulatory Visit (HOSPITAL_BASED_OUTPATIENT_CLINIC_OR_DEPARTMENT_OTHER): Payer: PRIVATE HEALTH INSURANCE | Admitting: Physician Assistant

## 2013-01-20 ENCOUNTER — Other Ambulatory Visit (HOSPITAL_BASED_OUTPATIENT_CLINIC_OR_DEPARTMENT_OTHER): Payer: PRIVATE HEALTH INSURANCE | Admitting: Lab

## 2013-01-20 ENCOUNTER — Telehealth: Payer: Self-pay | Admitting: Internal Medicine

## 2013-01-20 VITALS — BP 142/73 | HR 82 | Temp 97.8°F | Resp 20 | Ht 62.0 in | Wt 224.0 lb

## 2013-01-20 DIAGNOSIS — C3490 Malignant neoplasm of unspecified part of unspecified bronchus or lung: Secondary | ICD-10-CM

## 2013-01-20 DIAGNOSIS — C3410 Malignant neoplasm of upper lobe, unspecified bronchus or lung: Secondary | ICD-10-CM

## 2013-01-20 LAB — COMPREHENSIVE METABOLIC PANEL (CC13)
Albumin: 3.5 g/dL (ref 3.5–5.0)
Alkaline Phosphatase: 100 U/L (ref 40–150)
BUN: 17.6 mg/dL (ref 7.0–26.0)
CO2: 27 mEq/L (ref 22–29)
Glucose: 94 mg/dl (ref 70–99)
Potassium: 3.7 mEq/L (ref 3.5–5.1)

## 2013-01-20 LAB — CBC WITH DIFFERENTIAL/PLATELET
Basophils Absolute: 0.1 10*3/uL (ref 0.0–0.1)
Eosinophils Absolute: 0.2 10*3/uL (ref 0.0–0.5)
HGB: 11.4 g/dL — ABNORMAL LOW (ref 11.6–15.9)
LYMPH%: 31.5 % (ref 14.0–49.7)
MCV: 86.7 fL (ref 79.5–101.0)
MONO#: 0.5 10*3/uL (ref 0.1–0.9)
MONO%: 14.1 % — ABNORMAL HIGH (ref 0.0–14.0)
NEUT#: 1.8 10*3/uL (ref 1.5–6.5)
Platelets: 188 10*3/uL (ref 145–400)

## 2013-01-20 NOTE — Patient Instructions (Addendum)
Continue as chemotherapy as scheduled on 01/26/2013 Continue with weekly labs as scheduled Followup in 4 weeks prior to cycle #3 of your chemotherapy

## 2013-01-20 NOTE — Telephone Encounter (Signed)
gv and printed appt schedule for pt for March...Shannon Camacho.w and emailed michelle....pt aware

## 2013-01-20 NOTE — Progress Notes (Signed)
Shannon Camacho   [phone]  OFFICE PROGRESS NOTE  Shannon Letters, Shannon Camacho [address] [place] [address]  DIAGNOSIS: Metastatic non-small cell lung cancer, adenocarcinoma with negative EGFR mutation and positive BRAF mutation, diagnosed  in June of 2010.   PRIOR THERAPY: Status post treatment with zelboraf 960 mg by mouth daily for less than 2 weeks at [facility] in [facility] in June of 2013 discontinued secondary to intolerance.  CURRENT THERAPY:  Systemic chemotherapy with carboplatin for AUC of 5 and Alimta 500 mg/M2 every 3 weeks. Status post 1 cycle   INTERVAL HISTORY: Shannon Camacho 79 y.o. female returns to the clinic today for routine followup visit accompanied her daughter. She tolerated her first cycle of systemic chemotherapy with carboplatin and Alimta relatively well the exception of some mild nausea and constipation. The nausea was well-controlled with her antiemetic tablets. The constipation is multifocal, related to the chemotherapy as well as her oral iron tablets. She's currently taking her iron tablet a half a tablet twice daily as this tends to lead to less constipation. She currently using a stool softener as well as some intermittent Epsom salts to control her constipation. The patient is feeling fine today with no specific complaints.  The patient denied having any significant chest pain but continues to have shortness breath with exertion, no cough or hemoptysis. She denied having any significant weight loss or night sweats.   MEDICAL HISTORY: Past Medical History  Diagnosis Date  . Hypertension   . lung ca dx'd 04/2009    ALLERGIES:  is allergic to zelboraf.  MEDICATIONS:  Current Outpatient Prescriptions  Medication Sig Dispense Refill  . aspirin 81 MG tablet Take 81 mg by mouth daily.      . cholecalciferol (VITAMIN D) 400 UNITS TABS Take 400 Units by mouth  daily.      . ciclopirox (LOPROX) 0.77 % cream Apply 1 application topically daily. Uses for feet.      Marland Kitchen dexamethasone (DECADRON) 4 MG tablet 4 mg by mouth twice a day the day before, day of and day after the chemotherapy every 3 weeks with chemotherapy  40 tablet  0  . diclofenac sodium (VOLTAREN) 1 % GEL Apply 4 g topically 4 (four) times daily. Uses on knees  100 g  12  . folic acid (FOLVITE) 1 MG tablet Take 1 tablet (1 mg total) by mouth daily.  30 tablet  4  . hydrochlorothiazide (MICROZIDE) 12.5 MG capsule Take 12.5 mg by mouth daily.      Marland Kitchen levothyroxine (SYNTHROID, LEVOTHROID) 88 MCG tablet Take 1 tablet (88 mcg total) by mouth daily.  90 tablet  3  . metoprolol tartrate (LOPRESSOR) 25 MG tablet Take 25 mg by mouth 2 (two) times daily.      . pravastatin (PRAVACHOL) 40 MG tablet Take 40 mg by mouth daily. Take 1 1/2 tablet by mouth once daily      . prochlorperazine (COMPAZINE) 10 MG tablet Take 1 tablet (10 mg total) by mouth every 6 (six) hours as needed.  60 tablet  0  . traMADol (ULTRAM) 50 MG tablet Take 50 mg by mouth every 6 (six) hours as needed.      . traZODone (DESYREL) 50 MG tablet Take 0.5-1 tablets (25-50 mg total) by mouth at bedtime as needed for sleep.  30 tablet  3   No current facility-administered medications for this visit.    REVIEW  OF SYSTEMS:  A comprehensive review of systems was negative except for: Respiratory: positive for dyspnea on exertion Gastrointestinal: positive for constipation   PHYSICAL EXAMINATION: General appearance: alert, cooperative and no distress Head: Normocephalic, without obvious abnormality, atraumatic Neck: no adenopathy Lymph nodes: Cervical, supraclavicular, and axillary nodes normal. Resp: clear to auscultation bilaterally Cardio: regular rate and rhythm, S1, S2 normal, no murmur, click, rub or gallop GI: soft, non-tender; bowel sounds normal; no masses,  no organomegaly Extremities: extremities normal, atraumatic, no cyanosis  or edema Neurologic: Alert and oriented X 3, normal strength and tone. Normal symmetric reflexes. Normal coordination and gait  ECOG PERFORMANCE STATUS: 1 - Symptomatic but completely ambulatory  Blood pressure 142/73, pulse 82, temperature 97.8 F (36.6 C), temperature source Oral, resp. rate 20, height 5\' 2"  (1.575 m), weight 224 lb (101.606 kg).  LABORATORY DATA: Lab Results  Component Value Date   WBC 3.8* 01/20/2013   HGB 11.4* 01/20/2013   HCT 34.8 01/20/2013   MCV 86.7 01/20/2013   PLT 188 01/20/2013      Chemistry      Component Value Date/Time   NA 144 01/20/2013 0935   NA 143 12/03/2012 1653   K 3.7 01/20/2013 0935   K 3.8 12/03/2012 1653   CL 106 01/20/2013 0935   CL 109 12/03/2012 1653   CO2 27 01/20/2013 0935   CO2 27 12/03/2012 1653   BUN 17.6 01/20/2013 0935   BUN 21 12/03/2012 1653   CREATININE 1.1 01/20/2013 0935   CREATININE 0.91 12/03/2012 1653   CREATININE 1.10 09/12/2009 1316      Component Value Date/Time   CALCIUM 9.3 01/20/2013 0935   CALCIUM 9.1 12/03/2012 1653   ALKPHOS 100 01/20/2013 0935   ALKPHOS 86 12/03/2012 1653   AST 20 01/20/2013 0935   AST 20 12/03/2012 1653   ALT 23 01/20/2013 0935   ALT 15 12/03/2012 1653   BILITOT 0.37 01/20/2013 0935   BILITOT 0.3 12/03/2012 1653       RADIOGRAPHIC STUDIES: Ct Head W Wo Contrast  12/25/2012  *RADIOLOGY REPORT*  Clinical Data: Lung cancer.  CT HEAD WITHOUT AND WITH CONTRAST  Technique:  Contiguous axial images were obtained from the base of the skull through the vertex without and with intravenous contrast.  Contrast: OMNIPAQUE IOHEXOL 300 MG/ML  SOLN  Comparison: MRI brain without and with contrast 06/02/2009.  Findings: No acute cortical infarct, hemorrhage, or mass lesion is present.  The postcontrast images demonstrate no pathologic enhancement to suggest metastatic disease of the brain.  Moderate atrophy and white matter disease is stable.  The ventricles are proportionate to the degree of atrophy.  No significant  extra-axial fluid collection is present.  The paranasal sinuses and mastoid air cells are clear.  The osseous skull is intact.  Atherosclerotic calcifications are noted in the cavernous carotid arteries and at the dural margin of the vertebral arteries bilaterally.  IMPRESSION:  1.  Stable atrophy diffuse white matter disease. 2.  No evidence for metastatic disease of the brain. 3.  No acute intracranial abnormality.   Original Report Authenticated By: Marin Roberts, M.D.    Ct Chest W Contrast  12/25/2012  *RADIOLOGY REPORT*  Clinical Data:  History of lung cancer.  Cough.  CT CHEST, ABDOMEN AND PELVIS WITH CONTRAST  Technique:  Multidetector CT imaging of the chest, abdomen and pelvis was performed following the standard protocol during bolus administration of intravenous contrast.  Contrast: OMNIPAQUE IOHEXOL 300 MG/ML  SOLN  Comparison:  CT chest, abdomen and pelvis 09/12/2009.   CT CHEST  Findings:  There is new lymphadenopathy in the chest.  Index right paratracheal node which had measured 1 cm short axis dimension today measures 1.7 cm on image 18.  Right precarinal node which had measured 0.3 cm short axis dimension today measures 1.6 cm on image 19.  Subcarinal lymph node which had measured 0.9 cm short axis dimension today measures 1.8 cm on image 24.  There is a new small right pleural effusion.  No left pleural effusion or pericardial effusion is identified.  Lungs demonstrate a large right upper lobe mass measuring 7.2 x 6.1 cm on image 19.  Extensive interlobular septal thickening in the right upper lobe is compatible with lymphangitic tumor spread.  Two new right upper lobe pulmonary nodules are identified on image 22 each measuring 0.4 the centimeters in diameter.  There are also new pulmonary nodules on the left.  A left upper lobe nodule on image 30 measures 0.5 cm short axis dimension.  Left lower lobe nodule on image 42 measures 1.7 x 1.1 cm.  Additionally, an index right lower  lobe nodule on image 36 measures 0.6 cm. No focal bony lesion is identified.  IMPRESSION: Findings consistent with marked interval progression of lung carcinoma with a large right upper lobe mass, lymphangitic tumor spread in the right upper lobe, multiple pulmonary nodules and lymphadenopathy as described above.   CT ABDOMEN AND PELVIS  Findings:  Left adrenal nodule compatible with a benign adenoma is not markedly changed.  The right adrenal gland, spleen, pancreas and liver are unremarkable.  The patient is status post cholecystectomy.  Small low attenuating lesions in the left kidney are unchanged in compatible with cysts.  The right kidney appears normal.  Small fat containing umbilical hernia is identified. There is no lymphadenopathy or fluid.  Uterus, adnexa and urinary bladder appear normal.  The appendix, stomach and small and large bowel appear normal.  No worrisome bony lesion is seen with some degenerative change about the hips and lower lumbar spine noted.  IMPRESSION:  Negative for metastatic or acute disease.   Original Report Authenticated By: Holley Dexter, M.D.     ASSESSMENT/PLAN: This is a very pleasant 79 years old African American female with metastatic non-small cell lung cancer, adenocarcinoma with negative EGFR mutation and positive BRAF mutation. The patient has evidence for disease progression on his recent scan compared to her initial scan in 2010 with enlarging right upper lobe mass as well as bilateral pulmonary nodules and questionable lymphangitic spread of the tumor. She is currently being treated with systemic chemotherapy in the form of carboplatin for an AUC of 5 and Alimta at 500 mg per meter squared to 3 weeks, status post 1 cycle. Patient was discussed with Dr. Arbutus Ped. She will proceed with cycle #2 her systemic chemotherapy with carboplatin and Alimta as scheduled on 01/26/2013. She will continue with weekly labs consisting of a CBC differential and C. Met. She will  followup in 4 weeks prior to cycle #3 with a repeat CBC differential and C. met.   Shannon Camacho, Shannon Rupnow E, PA-C   All questions were answered. The patient knows to call the clinic with any problems, questions or concerns. We can certainly see the patient much sooner if necessary.  I spent 20 minutes counseling the patient face to face. The total time spent in the appointment was 30 minutes.

## 2013-01-26 ENCOUNTER — Other Ambulatory Visit (HOSPITAL_BASED_OUTPATIENT_CLINIC_OR_DEPARTMENT_OTHER): Payer: PRIVATE HEALTH INSURANCE | Admitting: Lab

## 2013-01-26 ENCOUNTER — Other Ambulatory Visit: Payer: Self-pay | Admitting: Internal Medicine

## 2013-01-26 ENCOUNTER — Ambulatory Visit (HOSPITAL_BASED_OUTPATIENT_CLINIC_OR_DEPARTMENT_OTHER): Payer: PRIVATE HEALTH INSURANCE

## 2013-01-26 DIAGNOSIS — C3410 Malignant neoplasm of upper lobe, unspecified bronchus or lung: Secondary | ICD-10-CM

## 2013-01-26 DIAGNOSIS — Z5111 Encounter for antineoplastic chemotherapy: Secondary | ICD-10-CM

## 2013-01-26 DIAGNOSIS — C3490 Malignant neoplasm of unspecified part of unspecified bronchus or lung: Secondary | ICD-10-CM

## 2013-01-26 LAB — COMPREHENSIVE METABOLIC PANEL (CC13)
ALT: 20 U/L (ref 0–55)
Alkaline Phosphatase: 100 U/L (ref 40–150)
CO2: 22 mEq/L (ref 22–29)
Sodium: 141 mEq/L (ref 136–145)
Total Bilirubin: 0.22 mg/dL (ref 0.20–1.20)
Total Protein: 7.6 g/dL (ref 6.4–8.3)

## 2013-01-26 LAB — CBC WITH DIFFERENTIAL/PLATELET
BASO%: 0.2 % (ref 0.0–2.0)
LYMPH%: 9.7 % — ABNORMAL LOW (ref 14.0–49.7)
MCHC: 32.8 g/dL (ref 31.5–36.0)
MONO#: 0.6 10*3/uL (ref 0.1–0.9)
Platelets: 300 10*3/uL (ref <2.0)
RBC: 3.97 10*6/uL (ref 3.70–5.45)
RDW: 14.9 % — ABNORMAL HIGH (ref 11.2–14.5)
WBC: 8 10*3/uL (ref 3.9–10.3)

## 2013-01-26 MED ORDER — ONDANSETRON 16 MG/50ML IVPB (CHCC)
16.0000 mg | Freq: Once | INTRAVENOUS | Status: AC
  Administered 2013-01-26: 16 mg via INTRAVENOUS

## 2013-01-26 MED ORDER — SODIUM CHLORIDE 0.9 % IV SOLN
440.0000 mg | Freq: Once | INTRAVENOUS | Status: AC
  Administered 2013-01-26: 440 mg via INTRAVENOUS
  Filled 2013-01-26: qty 44

## 2013-01-26 MED ORDER — DEXAMETHASONE SODIUM PHOSPHATE 4 MG/ML IJ SOLN
20.0000 mg | Freq: Once | INTRAMUSCULAR | Status: AC
  Administered 2013-01-26: 20 mg via INTRAVENOUS

## 2013-01-26 MED ORDER — SODIUM CHLORIDE 0.9 % IV SOLN
Freq: Once | INTRAVENOUS | Status: AC
  Administered 2013-01-26: 11:00:00 via INTRAVENOUS

## 2013-01-26 MED ORDER — SODIUM CHLORIDE 0.9 % IV SOLN
500.0000 mg/m2 | Freq: Once | INTRAVENOUS | Status: AC
  Administered 2013-01-26: 1050 mg via INTRAVENOUS
  Filled 2013-01-26: qty 42

## 2013-01-26 NOTE — Progress Notes (Signed)
Quick Note:  Call patient with the result and order K Dur 20 meq po qd x7 days ______ 

## 2013-01-26 NOTE — Patient Instructions (Addendum)
[facility] Discharge Instructions for Patients Receiving Chemotherapy  Today you received the following chemotherapy agents alimta/ carboplatin  To help prevent nausea and vomiting after your treatment, we encourage you to take your nausea medication  and take it as often as prescribed   If you develop nausea and vomiting that is not controlled by your nausea medication, call the clinic. If it is after clinic hours your family physician or the after hours number for the clinic or go to the Emergency Department.   BELOW ARE SYMPTOMS THAT SHOULD BE REPORTED IMMEDIATELY:  *FEVER GREATER THAN 100.5 F  *CHILLS WITH OR WITHOUT FEVER  NAUSEA AND VOMITING THAT IS NOT CONTROLLED WITH YOUR NAUSEA MEDICATION  *UNUSUAL SHORTNESS OF BREATH  *UNUSUAL BRUISING OR BLEEDING  TENDERNESS IN MOUTH AND THROAT WITH OR WITHOUT PRESENCE OF ULCERS  *URINARY PROBLEMS  *BOWEL PROBLEMS  UNUSUAL RASH Items with * indicate a potential emergency and should be followed up as soon as possible.  One of the nurses will contact you 24 hours after your treatment. Please let the nurse know about any problems that you may have experienced. Feel free to call the clinic you have any questions or concerns. The clinic phone number is [phone].   I have been informed and understand all the instructions given to me. I know to contact the clinic, my physician, or go to the Emergency Department if any problems should occur. I do not have any questions at this time, but understand that I may call the clinic during office hours   should I have any questions or need assistance in obtaining follow up care.    __________________________________________  _____________  __________ Signature of Patient or Authorized Representative            Date                   Time    __________________________________________ Nurse's Signature    

## 2013-01-27 ENCOUNTER — Telehealth: Payer: Self-pay | Admitting: Medical Oncology

## 2013-01-27 DIAGNOSIS — E876 Hypokalemia: Secondary | ICD-10-CM

## 2013-01-27 MED ORDER — POTASSIUM CHLORIDE CRYS ER 20 MEQ PO TBCR: 20.0000 meq | EXTENDED_RELEASE_TABLET | Freq: Every day | ORAL | Status: DC

## 2013-01-27 NOTE — Telephone Encounter (Signed)
Called in kdur to pharmacy and pt.

## 2013-01-27 NOTE — Telephone Encounter (Signed)
Message copied by Charma Igo on Tue Jan 27, 2013 10:33 AM ------      Message from: Si Gaul      Created: Mon Jan 26, 2013  7:26 PM       Call patient with the result and order K Dur 20 meq po qd x 7 days. ------

## 2013-02-02 ENCOUNTER — Other Ambulatory Visit (HOSPITAL_BASED_OUTPATIENT_CLINIC_OR_DEPARTMENT_OTHER): Payer: PRIVATE HEALTH INSURANCE

## 2013-02-02 DIAGNOSIS — C3490 Malignant neoplasm of unspecified part of unspecified bronchus or lung: Secondary | ICD-10-CM

## 2013-02-02 DIAGNOSIS — C3410 Malignant neoplasm of upper lobe, unspecified bronchus or lung: Secondary | ICD-10-CM

## 2013-02-02 LAB — COMPREHENSIVE METABOLIC PANEL (CC13)
Albumin: 3.4 g/dL — ABNORMAL LOW (ref 3.5–5.0)
CO2: 27 mEq/L (ref 22–29)
Glucose: 128 mg/dl — ABNORMAL HIGH (ref 70–99)
Potassium: 3.5 mEq/L (ref 3.5–5.1)
Sodium: 142 mEq/L (ref 136–145)
Total Protein: 7.2 g/dL (ref 6.4–8.3)

## 2013-02-02 LAB — CBC WITH DIFFERENTIAL/PLATELET
Eosinophils Absolute: 0.1 10*3/uL (ref 0.0–0.5)
MONO#: 0.3 10*3/uL (ref 0.1–0.9)
NEUT#: 1.1 10*3/uL — ABNORMAL LOW (ref 1.5–6.5)
Platelets: 256 10*3/uL (ref 145–400)
RBC: 4.06 10*6/uL (ref 3.70–5.45)
RDW: 14.7 % — ABNORMAL HIGH (ref 11.2–14.5)
WBC: 2.7 10*3/uL — ABNORMAL LOW (ref 3.9–10.3)

## 2013-02-09 ENCOUNTER — Other Ambulatory Visit (HOSPITAL_BASED_OUTPATIENT_CLINIC_OR_DEPARTMENT_OTHER): Payer: PRIVATE HEALTH INSURANCE | Admitting: Lab

## 2013-02-09 DIAGNOSIS — C3410 Malignant neoplasm of upper lobe, unspecified bronchus or lung: Secondary | ICD-10-CM

## 2013-02-09 LAB — CBC WITH DIFFERENTIAL/PLATELET
Eosinophils Absolute: 0.1 10*3/uL (ref 0.0–0.5)
MONO#: 1 10*3/uL — ABNORMAL HIGH (ref 0.1–0.9)
MONO%: 15.4 % — ABNORMAL HIGH (ref 0.0–14.0)
NEUT#: 3.6 10*3/uL (ref 1.5–6.5)
RBC: 2.61 10*6/uL — ABNORMAL LOW (ref 3.70–5.45)
RDW: 14.9 % — ABNORMAL HIGH (ref 11.2–14.5)
WBC: 6.2 10*3/uL (ref 3.9–10.3)

## 2013-02-09 LAB — COMPREHENSIVE METABOLIC PANEL (CC13)
ALT: 26 U/L (ref 0–55)
Albumin: 3.5 g/dL (ref 3.5–5.0)
Alkaline Phosphatase: 87 U/L (ref 40–150)
CO2: 26 mEq/L (ref 22–29)
Glucose: 122 mg/dl — ABNORMAL HIGH (ref 70–99)
Potassium: 3.4 mEq/L — ABNORMAL LOW (ref 3.5–5.1)
Sodium: 140 mEq/L (ref 136–145)
Total Protein: 6.8 g/dL (ref 6.4–8.3)

## 2013-02-10 ENCOUNTER — Telehealth: Payer: Self-pay | Admitting: Family Medicine

## 2013-02-10 DIAGNOSIS — E039 Hypothyroidism, unspecified: Secondary | ICD-10-CM

## 2013-02-10 MED ORDER — PRAVASTATIN SODIUM 40 MG PO TABS: 60.0000 mg | ORAL_TABLET | Freq: Every day | ORAL | Status: DC

## 2013-02-10 MED ORDER — LEVOTHYROXINE SODIUM 88 MCG PO TABS: 88.0000 ug | ORAL_TABLET | Freq: Every day | ORAL | Status: DC

## 2013-02-10 MED ORDER — HYDROCHLOROTHIAZIDE 12.5 MG PO CAPS: 12.5000 mg | ORAL_CAPSULE | Freq: Every day | ORAL | Status: DC

## 2013-02-10 MED ORDER — METOPROLOL TARTRATE 25 MG PO TABS: 25.0000 mg | ORAL_TABLET | Freq: Two times a day (BID) | ORAL | Status: DC

## 2013-02-10 NOTE — Telephone Encounter (Signed)
Message given to daughter.

## 2013-02-10 NOTE — Telephone Encounter (Signed)
I have provided 30 day supply of meds to get her to her scheduled appointment with me.

## 2013-02-10 NOTE — Telephone Encounter (Signed)
LVM for patient to call back to inform of below message 

## 2013-02-10 NOTE — Telephone Encounter (Signed)
Pt has appt w/. [facility] on 4/16 and will be out of the meds she was on before she moved here, Needs refill on: Pravastatin Synthroid HCTZ Metoprolol [facility]- [address]

## 2013-02-16 ENCOUNTER — Telehealth: Payer: Self-pay | Admitting: Internal Medicine

## 2013-02-16 ENCOUNTER — Other Ambulatory Visit (HOSPITAL_BASED_OUTPATIENT_CLINIC_OR_DEPARTMENT_OTHER): Payer: PRIVATE HEALTH INSURANCE | Admitting: Lab

## 2013-02-16 ENCOUNTER — Encounter: Payer: Self-pay | Admitting: Internal Medicine

## 2013-02-16 ENCOUNTER — Ambulatory Visit (HOSPITAL_BASED_OUTPATIENT_CLINIC_OR_DEPARTMENT_OTHER): Payer: PRIVATE HEALTH INSURANCE | Admitting: Physician Assistant

## 2013-02-16 ENCOUNTER — Other Ambulatory Visit: Payer: Self-pay | Admitting: Oncology

## 2013-02-16 ENCOUNTER — Telehealth: Payer: Self-pay | Admitting: *Deleted

## 2013-02-16 ENCOUNTER — Ambulatory Visit (HOSPITAL_BASED_OUTPATIENT_CLINIC_OR_DEPARTMENT_OTHER): Payer: PRIVATE HEALTH INSURANCE

## 2013-02-16 VITALS — BP 119/66 | HR 68 | Temp 97.2°F | Resp 22 | Ht 62.0 in | Wt 224.6 lb

## 2013-02-16 DIAGNOSIS — C3410 Malignant neoplasm of upper lobe, unspecified bronchus or lung: Secondary | ICD-10-CM

## 2013-02-16 DIAGNOSIS — R911 Solitary pulmonary nodule: Secondary | ICD-10-CM

## 2013-02-16 DIAGNOSIS — R0602 Shortness of breath: Secondary | ICD-10-CM

## 2013-02-16 DIAGNOSIS — Z5111 Encounter for antineoplastic chemotherapy: Secondary | ICD-10-CM

## 2013-02-16 DIAGNOSIS — C3490 Malignant neoplasm of unspecified part of unspecified bronchus or lung: Secondary | ICD-10-CM

## 2013-02-16 LAB — CBC WITH DIFFERENTIAL/PLATELET
Basophils Absolute: 0 10*3/uL (ref 0.0–0.1)
EOS%: 2 % (ref 0.0–7.0)
Eosinophils Absolute: 0.2 10*3/uL (ref 0.0–0.5)
HGB: 8.2 g/dL — ABNORMAL LOW (ref 11.6–15.9)
MCV: 90.7 fL (ref 79.5–101.0)
MONO%: 5.1 % (ref 0.0–14.0)
NEUT#: 6 10*3/uL (ref 1.5–6.5)
RBC: 2.91 10*6/uL — ABNORMAL LOW (ref 3.70–5.45)
RDW: 18.3 % — ABNORMAL HIGH (ref 11.2–14.5)
lymph#: 1.1 10*3/uL (ref 0.9–3.3)
nRBC: 0 % (ref 0–0)

## 2013-02-16 LAB — COMPREHENSIVE METABOLIC PANEL (CC13)
ALT: 32 U/L (ref 0–55)
Alkaline Phosphatase: 102 U/L (ref 40–150)
CO2: 23 mEq/L (ref 22–29)
Calcium: 9.6 mg/dL (ref 8.4–10.4)
Potassium: 4 mEq/L (ref 3.5–5.1)
Sodium: 142 mEq/L (ref 136–145)
Total Bilirubin: 0.21 mg/dL (ref 0.20–1.20)

## 2013-02-16 MED ORDER — PROCHLORPERAZINE MALEATE 10 MG PO TABS: 10.0000 mg | ORAL_TABLET | Freq: Four times a day (QID) | ORAL | Status: DC | PRN

## 2013-02-16 MED ORDER — SODIUM CHLORIDE 0.9 % IV SOLN
415.0000 mg | Freq: Once | INTRAVENOUS | Status: AC
  Administered 2013-02-16: 420 mg via INTRAVENOUS
  Filled 2013-02-16: qty 42

## 2013-02-16 MED ORDER — ONDANSETRON 16 MG/50ML IVPB (CHCC)
16.0000 mg | Freq: Once | INTRAVENOUS | Status: AC
  Administered 2013-02-16: 16 mg via INTRAVENOUS

## 2013-02-16 MED ORDER — DEXAMETHASONE SODIUM PHOSPHATE 4 MG/ML IJ SOLN
20.0000 mg | Freq: Once | INTRAMUSCULAR | Status: AC
  Administered 2013-02-16: 20 mg via INTRAVENOUS

## 2013-02-16 MED ORDER — SODIUM CHLORIDE 0.9 % IV SOLN
500.0000 mg/m2 | Freq: Once | INTRAVENOUS | Status: AC
  Administered 2013-02-16: 1050 mg via INTRAVENOUS
  Filled 2013-02-16: qty 42

## 2013-02-16 MED ORDER — SODIUM CHLORIDE 0.9 % IV SOLN
Freq: Once | INTRAVENOUS | Status: AC
  Administered 2013-02-16: 11:00:00 via INTRAVENOUS

## 2013-02-16 MED ORDER — CYANOCOBALAMIN 1000 MCG/ML IJ SOLN
1000.0000 ug | Freq: Once | INTRAMUSCULAR | Status: AC
  Administered 2013-02-16: 1000 ug via INTRAMUSCULAR

## 2013-02-16 NOTE — Progress Notes (Signed)
Discharged with daughter.  Ambulatory with use of cane in no distress.

## 2013-02-16 NOTE — Telephone Encounter (Signed)
Per staff phone call and POF I have schedueld appts.  JMW  

## 2013-02-16 NOTE — Patient Instructions (Signed)
[facility] Discharge Instructions for Patients Receiving Chemotherapy  Today you received the following chemotherapy agents Alimta, carboplatin  To help prevent nausea and vomiting after your treatment, we encourage you to take your nausea medication Compazine 10 mg. Begin taking it at anytime today upon discharge and take it as often as prescribed every six hours if needed for the next 72 hours and as needed.   If you develop nausea and vomiting that is not controlled by your nausea medication, call the clinic. If it is after clinic hours your family physician or the after hours number for the clinic or go to the Emergency Department.   BELOW ARE SYMPTOMS THAT SHOULD BE REPORTED IMMEDIATELY:  *FEVER GREATER THAN 100.5 F  *CHILLS WITH OR WITHOUT FEVER  NAUSEA AND VOMITING THAT IS NOT CONTROLLED WITH YOUR NAUSEA MEDICATION  *UNUSUAL SHORTNESS OF BREATH  *UNUSUAL BRUISING OR BLEEDING  TENDERNESS IN MOUTH AND THROAT WITH OR WITHOUT PRESENCE OF ULCERS  *URINARY PROBLEMS  *BOWEL PROBLEMS  UNUSUAL RASH Items with * indicate a potential emergency and should be followed up as soon as possible.  Please call to let a nurse know about any problems that you may have experienced. Feel free to call the clinic you have any questions or concerns. The clinic phone number is [phone].   I have been informed and understand all the instructions given to me. I know to contact the clinic, my physician, or go to the Emergency Department if any problems should occur. I do not have any questions at this time, but understand that I may call the clinic during office hours   should I have any questions or need assistance in obtaining follow up care.    __________________________________________  _____________  __________ Signature of Patient or Authorized Representative            Date                   Time    __________________________________________ Nurse's Signature

## 2013-02-16 NOTE — Patient Instructions (Addendum)
Continue weekly labs as scheduled Follow up with Dr, Arbutus Ped in 3 weeks with a restaging CT scan of your chest, abdomen and pelvis to re-evaluate your disease

## 2013-02-16 NOTE — Progress Notes (Signed)
Patient showed med list which includes compazine.  Re-entered with no end date.

## 2013-02-18 NOTE — Progress Notes (Signed)
[facility] Telephone:([phone]) [phone]   [phone]  OFFICE PROGRESS NOTE  Sanjuana Letters, MD [address] [place] [address]  DIAGNOSIS: Metastatic non-small cell lung cancer, adenocarcinoma with negative EGFR mutation and positive BRAF mutation, diagnosed  in June of 2010.   PRIOR THERAPY: Status post treatment with zelboraf 960 mg by mouth daily for less than 2 weeks at [facility] in [facility] in June of 2013 discontinued secondary to intolerance.  CURRENT THERAPY:  Systemic chemotherapy with carboplatin for AUC of 5 and Alimta 500 mg/M2 every 3 weeks. Status post 2 cycles   INTERVAL HISTORY: Shannon Camacho 79 y.o. female returns to the clinic today for routine followup visit accompanied her daughter. She is tolerating her  systemic chemotherapy with carboplatin and Alimta relatively well the exception of some mild nausea and constipation. The nausea was well-controlled with her antiemetic tablets. The constipation is multifocal, related to the chemotherapy as well as her oral iron tablets. The patient is feeling fine today with no other specific complaints.  The patient denied having any significant chest pain but continues to have shortness breath with exertion, no cough or hemoptysis. She denied having any significant weight loss or night sweats.   MEDICAL HISTORY: Past Medical History  Diagnosis Date  . Hypertension   . lung ca dx'd 04/2009    ALLERGIES:  is allergic to zelboraf.  MEDICATIONS:  Current Outpatient Prescriptions  Medication Sig Dispense Refill  . aspirin 81 MG tablet Take 81 mg by mouth daily.      . cholecalciferol (VITAMIN D) 400 UNITS TABS Take 400 Units by mouth daily.      . ciclopirox (LOPROX) 0.77 % cream Apply 1 application topically daily. Uses for feet.      Marland Kitchen dexamethasone (DECADRON) 4 MG tablet 4 mg by mouth twice a day the day before, day of and day after the chemotherapy  every 3 weeks with chemotherapy  40 tablet  0  . diclofenac sodium (VOLTAREN) 1 % GEL Apply 4 g topically 4 (four) times daily. Uses on knees  100 g  12  . folic acid (FOLVITE) 1 MG tablet Take 1 tablet (1 mg total) by mouth daily.  30 tablet  4  . hydrochlorothiazide (MICROZIDE) 12.5 MG capsule Take 1 capsule (12.5 mg total) by mouth daily.  30 capsule  0  . levothyroxine (SYNTHROID, LEVOTHROID) 88 MCG tablet Take 1 tablet (88 mcg total) by mouth daily.  30 tablet  0  . metoprolol tartrate (LOPRESSOR) 25 MG tablet Take 1 tablet (25 mg total) by mouth 2 (two) times daily.  60 tablet  0  . potassium chloride SA (K-DUR,KLOR-CON) 20 MEQ tablet Take 1 tablet (20 mEq total) by mouth daily.  7 tablet  0  . pravastatin (PRAVACHOL) 40 MG tablet Take 1.5 tablets (60 mg total) by mouth daily.  45 tablet  0  . prochlorperazine (COMPAZINE) 10 MG tablet Take 1 tablet (10 mg total) by mouth every 6 (six) hours as needed.  60 tablet  0  . traMADol (ULTRAM) 50 MG tablet Take 50 mg by mouth every 6 (six) hours as needed.      . traZODone (DESYREL) 50 MG tablet Take 0.5-1 tablets (25-50 mg total) by mouth at bedtime as needed for sleep.  30 tablet  3   No current facility-administered medications for this visit.    REVIEW OF SYSTEMS:  A comprehensive review of systems was negative  except for: Respiratory: positive for dyspnea on exertion Gastrointestinal: positive for constipation   PHYSICAL EXAMINATION: General appearance: alert, cooperative and no distress Head: Normocephalic, without obvious abnormality, atraumatic Neck: no adenopathy Lymph nodes: Cervical, supraclavicular, and axillary nodes normal. Resp: clear to auscultation bilaterally Cardio: regular rate and rhythm, S1, S2 normal, no murmur, click, rub or gallop GI: soft, non-tender; bowel sounds normal; no masses,  no organomegaly Extremities: extremities normal, atraumatic, no cyanosis or edema Neurologic: Alert and oriented X 3, normal strength  and tone. Normal symmetric reflexes. Normal coordination and gait  ECOG PERFORMANCE STATUS: 1 - Symptomatic but completely ambulatory  Blood pressure 119/66, pulse 68, temperature 97.2 F (36.2 C), temperature source Oral, resp. rate 22, height 5\' 2"  (1.575 m), weight 224 lb 9.6 oz (101.878 kg).  LABORATORY DATA: Lab Results  Component Value Date   WBC 7.6 02/16/2013   HGB 8.2* 02/16/2013   HCT 26.4* 02/16/2013   MCV 90.7 02/16/2013   PLT 312 02/16/2013      Chemistry      Component Value Date/Time   NA 142 02/16/2013 0846   NA 143 12/03/2012 1653   K 4.0 02/16/2013 0846   K 3.8 12/03/2012 1653   CL 107 02/16/2013 0846   CL 109 12/03/2012 1653   CO2 23 02/16/2013 0846   CO2 27 12/03/2012 1653   BUN 23.3 02/16/2013 0846   BUN 21 12/03/2012 1653   CREATININE 1.2* 02/16/2013 0846   CREATININE 0.91 12/03/2012 1653   CREATININE 1.10 09/12/2009 1316      Component Value Date/Time   CALCIUM 9.6 02/16/2013 0846   CALCIUM 9.1 12/03/2012 1653   ALKPHOS 102 02/16/2013 0846   ALKPHOS 86 12/03/2012 1653   AST 32 02/16/2013 0846   AST 20 12/03/2012 1653   ALT 32 02/16/2013 0846   ALT 15 12/03/2012 1653   BILITOT 0.21 02/16/2013 0846   BILITOT 0.3 12/03/2012 1653       RADIOGRAPHIC STUDIES: Ct Head W Wo Contrast  12/25/2012  *RADIOLOGY REPORT*  Clinical Data: Lung cancer.  CT HEAD WITHOUT AND WITH CONTRAST  Technique:  Contiguous axial images were obtained from the base of the skull through the vertex without and with intravenous contrast.  Contrast: OMNIPAQUE IOHEXOL 300 MG/ML  SOLN  Comparison: MRI brain without and with contrast 06/02/2009.  Findings: No acute cortical infarct, hemorrhage, or mass lesion is present.  The postcontrast images demonstrate no pathologic enhancement to suggest metastatic disease of the brain.  Moderate atrophy and white matter disease is stable.  The ventricles are proportionate to the degree of atrophy.  No significant extra-axial fluid collection is present.  The paranasal  sinuses and mastoid air cells are clear.  The osseous skull is intact.  Atherosclerotic calcifications are noted in the cavernous carotid arteries and at the dural margin of the vertebral arteries bilaterally.  IMPRESSION:  1.  Stable atrophy diffuse white matter disease. 2.  No evidence for metastatic disease of the brain. 3.  No acute intracranial abnormality.   Original Report Authenticated By: Marin Roberts, M.D.    Ct Chest W Contrast  12/25/2012  *RADIOLOGY REPORT*  Clinical Data:  History of lung cancer.  Cough.  CT CHEST, ABDOMEN AND PELVIS WITH CONTRAST  Technique:  Multidetector CT imaging of the chest, abdomen and pelvis was performed following the standard protocol during bolus administration of intravenous contrast.  Contrast: OMNIPAQUE IOHEXOL 300 MG/ML  SOLN  Comparison:  CT chest, abdomen and pelvis 09/12/2009.  CT CHEST  Findings:  There is new lymphadenopathy in the chest.  Index right paratracheal node which had measured 1 cm short axis dimension today measures 1.7 cm on image 18.  Right precarinal node which had measured 0.3 cm short axis dimension today measures 1.6 cm on image 19.  Subcarinal lymph node which had measured 0.9 cm short axis dimension today measures 1.8 cm on image 24.  There is a new small right pleural effusion.  No left pleural effusion or pericardial effusion is identified.  Lungs demonstrate a large right upper lobe mass measuring 7.2 x 6.1 cm on image 19.  Extensive interlobular septal thickening in the right upper lobe is compatible with lymphangitic tumor spread.  Two new right upper lobe pulmonary nodules are identified on image 22 each measuring 0.4 the centimeters in diameter.  There are also new pulmonary nodules on the left.  A left upper lobe nodule on image 30 measures 0.5 cm short axis dimension.  Left lower lobe nodule on image 42 measures 1.7 x 1.1 cm.  Additionally, an index right lower lobe nodule on image 36 measures 0.6 cm. No focal bony  lesion is identified.  IMPRESSION: Findings consistent with marked interval progression of lung carcinoma with a large right upper lobe mass, lymphangitic tumor spread in the right upper lobe, multiple pulmonary nodules and lymphadenopathy as described above.   CT ABDOMEN AND PELVIS  Findings:  Left adrenal nodule compatible with a benign adenoma is not markedly changed.  The right adrenal gland, spleen, pancreas and liver are unremarkable.  The patient is status post cholecystectomy.  Small low attenuating lesions in the left kidney are unchanged in compatible with cysts.  The right kidney appears normal.  Small fat containing umbilical hernia is identified. There is no lymphadenopathy or fluid.  Uterus, adnexa and urinary bladder appear normal.  The appendix, stomach and small and large bowel appear normal.  No worrisome bony lesion is seen with some degenerative change about the hips and lower lumbar spine noted.  IMPRESSION:  Negative for metastatic or acute disease.   Original Report Authenticated By: Holley Dexter, M.D.     ASSESSMENT/PLAN: This is a very pleasant 79 years old African American female with metastatic non-small cell lung cancer, adenocarcinoma with negative EGFR mutation and positive BRAF mutation. The patient has evidence for disease progression on his recent scan compared to her initial scan in 2010 with enlarging right upper lobe mass as well as bilateral pulmonary nodules and questionable lymphangitic spread of the tumor. She is currently being treated with systemic chemotherapy in the form of carboplatin for an AUC of 5 and Alimta at 500 mg per meter squared to 3 weeks, status post 2 cycles. Patient was discussed with Dr. Arbutus Ped. She will proceed with cycle #3 her systemic chemotherapy with carboplatin and Alimta as scheduled. She will continue with weekly labs consisting of a CBC differential and C. Met. She'll followup with Dr. Arbutus Ped in 3 weeks with repeat CBC differential, C.  met and CT of the chest, abdomen and pelvis with contrast to reevaluate her disease.   Laural Benes, Zebulan Hinshaw E, PA-C   All questions were answered. The patient knows to call the clinic with any problems, questions or concerns. We can certainly see the patient much sooner if necessary.  I spent 20 minutes counseling the patient face to face. The total time spent in the appointment was 30 minutes.

## 2013-02-23 ENCOUNTER — Other Ambulatory Visit (HOSPITAL_BASED_OUTPATIENT_CLINIC_OR_DEPARTMENT_OTHER): Payer: PRIVATE HEALTH INSURANCE | Admitting: Lab

## 2013-02-23 ENCOUNTER — Other Ambulatory Visit: Payer: PRIVATE HEALTH INSURANCE

## 2013-02-23 DIAGNOSIS — C3410 Malignant neoplasm of upper lobe, unspecified bronchus or lung: Secondary | ICD-10-CM

## 2013-02-23 DIAGNOSIS — C3490 Malignant neoplasm of unspecified part of unspecified bronchus or lung: Secondary | ICD-10-CM

## 2013-02-23 LAB — COMPREHENSIVE METABOLIC PANEL (CC13)
ALT: 35 U/L (ref 0–55)
AST: 33 U/L (ref 5–34)
Albumin: 3.3 g/dL — ABNORMAL LOW (ref 3.5–5.0)
CO2: 24 mEq/L (ref 22–29)
Calcium: 8.9 mg/dL (ref 8.4–10.4)
Chloride: 107 mEq/L (ref 98–107)
Potassium: 3.5 mEq/L (ref 3.5–5.1)
Sodium: 141 mEq/L (ref 136–145)
Total Protein: 6.9 g/dL (ref 6.4–8.3)

## 2013-02-23 LAB — CBC WITH DIFFERENTIAL/PLATELET
BASO%: 1.4 % (ref 0.0–2.0)
Eosinophils Absolute: 0.1 10*3/uL (ref 0.0–0.5)
MCHC: 31.4 g/dL — ABNORMAL LOW (ref 31.5–36.0)
MONO#: 0.2 10*3/uL (ref 0.1–0.9)
NEUT#: 0.7 10*3/uL — ABNORMAL LOW (ref 1.5–6.5)
Platelets: 235 10*3/uL (ref 145–400)
RBC: 2.85 10*6/uL — ABNORMAL LOW (ref 3.70–5.45)
WBC: 2.1 10*3/uL — ABNORMAL LOW (ref 3.9–10.3)
lymph#: 1.1 10*3/uL (ref 0.9–3.3)
nRBC: 0 % (ref 0–0)

## 2013-03-02 ENCOUNTER — Other Ambulatory Visit (HOSPITAL_BASED_OUTPATIENT_CLINIC_OR_DEPARTMENT_OTHER): Payer: PRIVATE HEALTH INSURANCE | Admitting: Lab

## 2013-03-02 DIAGNOSIS — C3410 Malignant neoplasm of upper lobe, unspecified bronchus or lung: Secondary | ICD-10-CM

## 2013-03-02 DIAGNOSIS — C3490 Malignant neoplasm of unspecified part of unspecified bronchus or lung: Secondary | ICD-10-CM

## 2013-03-02 LAB — CBC WITH DIFFERENTIAL/PLATELET
BASO%: 0.7 % (ref 0.0–2.0)
EOS%: 3 % (ref 0.0–7.0)
HCT: 24.3 % — ABNORMAL LOW (ref 34.8–46.6)
MCH: 28.7 pg (ref 25.1–34.0)
MCHC: 31.7 g/dL (ref 31.5–36.0)
NEUT%: 46.8 % (ref 38.4–76.8)
RBC: 2.69 10*6/uL — ABNORMAL LOW (ref 3.70–5.45)
RDW: 18.4 % — ABNORMAL HIGH (ref 11.2–14.5)
WBC: 3.7 10*3/uL — ABNORMAL LOW (ref 3.9–10.3)
lymph#: 1.1 10*3/uL (ref 0.9–3.3)

## 2013-03-02 LAB — COMPREHENSIVE METABOLIC PANEL (CC13)
Albumin: 3.4 g/dL — ABNORMAL LOW (ref 3.5–5.0)
BUN: 15.2 mg/dL (ref 7.0–26.0)
CO2: 27 mEq/L (ref 22–29)
Calcium: 9 mg/dL (ref 8.4–10.4)
Chloride: 105 mEq/L (ref 98–107)
Creatinine: 1.1 mg/dL (ref 0.6–1.1)
Glucose: 105 mg/dl — ABNORMAL HIGH (ref 70–99)

## 2013-03-02 NOTE — Progress Notes (Signed)
Quick Note:  Call patient with the result and order k Dur 20 meq po qd X 7 days ______ 

## 2013-03-03 ENCOUNTER — Telehealth: Payer: Self-pay | Admitting: *Deleted

## 2013-03-03 ENCOUNTER — Other Ambulatory Visit: Payer: Self-pay | Admitting: *Deleted

## 2013-03-03 DIAGNOSIS — E876 Hypokalemia: Secondary | ICD-10-CM

## 2013-03-03 DIAGNOSIS — D649 Anemia, unspecified: Secondary | ICD-10-CM

## 2013-03-03 MED ORDER — POTASSIUM CHLORIDE CRYS ER 20 MEQ PO TBCR: 20.0000 meq | EXTENDED_RELEASE_TABLET | ORAL | Status: DC

## 2013-03-03 NOTE — Telephone Encounter (Signed)
Message copied by Caren Griffins on Tue Mar 03, 2013 12:05 PM ------      Message from: Si Gaul      Created: Mon Mar 02, 2013 11:35 PM       Call patient with the result and order k Dur 20 meq po qd X 7days ------

## 2013-03-03 NOTE — Telephone Encounter (Signed)
Left msg on vm with instructions regarding x 7 days.  SLJ

## 2013-03-03 NOTE — Telephone Encounter (Signed)
Pts hbg 7.7.  Per Dr Donnald Garre, pt needs 2 units PRBC's.  Spoke to the patient.  She states that she has felt better today then she has in a long time so she does not want a blood transfusion right now.  Labs will re-checked on Monday 4/14.  Pt wants to wait and see if hbg goes up on its own.  Will put type and hold in to be drawn 4/14.  Pt also states that she has stopped taking folic acid because it makes her constipated.  Encouraged pt to take stool softener.  She states she does.  She will start taking 1/2 a folic acid tablet again and talk to Dr Donnald Garre on 4/14.  Pt is to call back sooner if she feels she needs blood sooner.  SLJ

## 2013-03-06 ENCOUNTER — Ambulatory Visit (HOSPITAL_COMMUNITY)
Admission: RE | Admit: 2013-03-06 | Discharge: 2013-03-06 | Disposition: A | Discharge status: Home / Self Care | Payer: PRIVATE HEALTH INSURANCE | Source: Ambulatory Visit | Attending: Physician Assistant | Admitting: Physician Assistant

## 2013-03-06 ENCOUNTER — Encounter (HOSPITAL_COMMUNITY): Payer: Self-pay

## 2013-03-06 DIAGNOSIS — R05 Cough: Secondary | ICD-10-CM | POA: Insufficient documentation

## 2013-03-06 DIAGNOSIS — K5900 Constipation, unspecified: Secondary | ICD-10-CM | POA: Insufficient documentation

## 2013-03-06 DIAGNOSIS — R059 Cough, unspecified: Secondary | ICD-10-CM | POA: Insufficient documentation

## 2013-03-06 DIAGNOSIS — R599 Enlarged lymph nodes, unspecified: Secondary | ICD-10-CM | POA: Insufficient documentation

## 2013-03-06 DIAGNOSIS — C3490 Malignant neoplasm of unspecified part of unspecified bronchus or lung: Secondary | ICD-10-CM | POA: Insufficient documentation

## 2013-03-06 DIAGNOSIS — R109 Unspecified abdominal pain: Secondary | ICD-10-CM | POA: Insufficient documentation

## 2013-03-06 DIAGNOSIS — R112 Nausea with vomiting, unspecified: Secondary | ICD-10-CM | POA: Insufficient documentation

## 2013-03-06 DIAGNOSIS — Z923 Personal history of irradiation: Secondary | ICD-10-CM | POA: Insufficient documentation

## 2013-03-06 DIAGNOSIS — Z9089 Acquired absence of other organs: Secondary | ICD-10-CM | POA: Insufficient documentation

## 2013-03-06 DIAGNOSIS — E279 Disorder of adrenal gland, unspecified: Secondary | ICD-10-CM | POA: Insufficient documentation

## 2013-03-06 DIAGNOSIS — R1084 Generalized abdominal pain: Secondary | ICD-10-CM | POA: Insufficient documentation

## 2013-03-06 DIAGNOSIS — R918 Other nonspecific abnormal finding of lung field: Secondary | ICD-10-CM | POA: Insufficient documentation

## 2013-03-06 DIAGNOSIS — Z79899 Other long term (current) drug therapy: Secondary | ICD-10-CM | POA: Insufficient documentation

## 2013-03-06 DIAGNOSIS — N200 Calculus of kidney: Secondary | ICD-10-CM | POA: Insufficient documentation

## 2013-03-06 DIAGNOSIS — K838 Other specified diseases of biliary tract: Secondary | ICD-10-CM | POA: Insufficient documentation

## 2013-03-06 DIAGNOSIS — I700 Atherosclerosis of aorta: Secondary | ICD-10-CM | POA: Insufficient documentation

## 2013-03-06 MED ORDER — IOHEXOL 300 MG/ML  SOLN
100.0000 mL | Freq: Once | INTRAMUSCULAR | Status: AC | PRN
  Administered 2013-03-06: 100 mL via INTRAVENOUS

## 2013-03-09 ENCOUNTER — Ambulatory Visit (HOSPITAL_BASED_OUTPATIENT_CLINIC_OR_DEPARTMENT_OTHER): Payer: PRIVATE HEALTH INSURANCE | Admitting: Internal Medicine

## 2013-03-09 ENCOUNTER — Encounter: Payer: Self-pay | Admitting: Internal Medicine

## 2013-03-09 ENCOUNTER — Ambulatory Visit (HOSPITAL_BASED_OUTPATIENT_CLINIC_OR_DEPARTMENT_OTHER): Payer: PRIVATE HEALTH INSURANCE

## 2013-03-09 ENCOUNTER — Telehealth: Payer: Self-pay | Admitting: *Deleted

## 2013-03-09 ENCOUNTER — Other Ambulatory Visit (HOSPITAL_BASED_OUTPATIENT_CLINIC_OR_DEPARTMENT_OTHER): Payer: PRIVATE HEALTH INSURANCE | Admitting: Lab

## 2013-03-09 ENCOUNTER — Telehealth: Payer: Self-pay | Admitting: Internal Medicine

## 2013-03-09 VITALS — BP 175/77 | HR 92 | Temp 97.6°F | Resp 20 | Ht 62.0 in | Wt 224.0 lb

## 2013-03-09 DIAGNOSIS — D649 Anemia, unspecified: Secondary | ICD-10-CM

## 2013-03-09 DIAGNOSIS — R0602 Shortness of breath: Secondary | ICD-10-CM

## 2013-03-09 DIAGNOSIS — C3490 Malignant neoplasm of unspecified part of unspecified bronchus or lung: Secondary | ICD-10-CM

## 2013-03-09 DIAGNOSIS — C3410 Malignant neoplasm of upper lobe, unspecified bronchus or lung: Secondary | ICD-10-CM

## 2013-03-09 DIAGNOSIS — Z5111 Encounter for antineoplastic chemotherapy: Secondary | ICD-10-CM

## 2013-03-09 LAB — CBC WITH DIFFERENTIAL/PLATELET
Basophils Absolute: 0 10*3/uL (ref 0.0–0.1)
HCT: 26.4 % — ABNORMAL LOW (ref 34.8–46.6)
HGB: 8.1 g/dL — ABNORMAL LOW (ref 11.6–15.9)
MONO#: 0.8 10*3/uL (ref 0.1–0.9)
NEUT#: 5.4 10*3/uL (ref 1.5–6.5)
NEUT%: 74.3 % (ref 38.4–76.8)
RDW: 18.5 % — ABNORMAL HIGH (ref 11.2–14.5)
WBC: 7.3 10*3/uL (ref 3.9–10.3)
lymph#: 1.1 10*3/uL (ref 0.9–3.3)

## 2013-03-09 LAB — COMPREHENSIVE METABOLIC PANEL (CC13)
ALT: 17 U/L (ref 0–55)
Albumin: 3.7 g/dL (ref 3.5–5.0)
Alkaline Phosphatase: 96 U/L (ref 40–150)
CO2: 24 mEq/L (ref 22–29)
Sodium: 143 mEq/L (ref 136–145)
Total Bilirubin: 0.28 mg/dL (ref 0.20–1.20)
Total Protein: 7.4 g/dL (ref 6.4–8.3)

## 2013-03-09 MED ORDER — SODIUM CHLORIDE 0.9 % IV SOLN: Freq: Once | INTRAVENOUS | Status: DC

## 2013-03-09 MED ORDER — ONDANSETRON 16 MG/50ML IVPB (CHCC)
16.0000 mg | Freq: Once | INTRAVENOUS | Status: AC
  Administered 2013-03-09: 16 mg via INTRAVENOUS

## 2013-03-09 MED ORDER — DEXAMETHASONE SODIUM PHOSPHATE 4 MG/ML IJ SOLN
20.0000 mg | Freq: Once | INTRAMUSCULAR | Status: AC
  Administered 2013-03-09: 20 mg via INTRAVENOUS

## 2013-03-09 MED ORDER — SODIUM CHLORIDE 0.9 % IV SOLN
443.5000 mg | Freq: Once | INTRAVENOUS | Status: AC
  Administered 2013-03-09: 440 mg via INTRAVENOUS
  Filled 2013-03-09: qty 44

## 2013-03-09 MED ORDER — SODIUM CHLORIDE 0.9 % IV SOLN
500.0000 mg/m2 | Freq: Once | INTRAVENOUS | Status: AC
  Administered 2013-03-09: 1050 mg via INTRAVENOUS
  Filled 2013-03-09: qty 42

## 2013-03-09 NOTE — Patient Instructions (Signed)
No evidence for disease progression on his recent scan. Followup visit in 3 weeks with the next cycle of chemotherapy.

## 2013-03-09 NOTE — Patient Instructions (Addendum)
[facility] Discharge Instructions for Patients Receiving Chemotherapy  Today you received the following chemotherapy agents Alimta/Carboplatin.  Call in to Dr [facility] office if you have increase shortness of breath or fatigue, we may need to check your blood for worsening anemia.  To help prevent nausea and vomiting after your treatment, we encourage you to take your nausea medication as directed.  If you develop nausea and vomiting that is not controlled by your nausea medication, call the clinic. If it is after clinic hours your family physician or the after hours number for the clinic or go to the Emergency Department.   BELOW ARE SYMPTOMS THAT SHOULD BE REPORTED IMMEDIATELY:  *FEVER GREATER THAN 100.5 F  *CHILLS WITH OR WITHOUT FEVER  NAUSEA AND VOMITING THAT IS NOT CONTROLLED WITH YOUR NAUSEA MEDICATION  *UNUSUAL SHORTNESS OF BREATH  *UNUSUAL BRUISING OR BLEEDING  TENDERNESS IN MOUTH AND THROAT WITH OR WITHOUT PRESENCE OF ULCERS  *URINARY PROBLEMS  *BOWEL PROBLEMS  UNUSUAL RASH Items with * indicate a potential emergency and should be followed up as soon as possible.  Feel free to call the clinic you have any questions or concerns. The clinic phone number is [phone]  Anemia, Nonspecific Your exam and blood tests show you are anemic. This means your blood (hemoglobin) level is low. Normal hemoglobin values are 12 to 15 g/dL for females and 14 to 17 g/dL for males. Make a note of your hemoglobin level today. The hematocrit percent is also used to measure anemia. A normal hematocrit is 38% to 46% in females and 42% to 49% in males. Make a note of your hematocrit level today. CAUSES  Anemia can be due to many different causes.  Excessive bleeding from periods (in women).  Intestinal bleeding.  Poor nutrition.  Kidney, thyroid, liver, and bone marrow diseases. SYMPTOMS  Anemia can come on suddenly (acute). It can also come on slowly. Symptoms  can include:  Minor weakness.  Dizziness.  Palpitations.  Shortness of breath. Symptoms may be absent until half your hemoglobin is missing if it comes on slowly. Anemia due to acute blood loss from an injury or internal bleeding may require blood transfusion if the loss is severe. Hospital care is needed if you are anemic and there is significant continual blood loss. TREATMENT   Stool tests for blood (Hemoccult) and additional lab tests are often needed. This determines the best treatment.  Further checking on your condition and your response to treatment is very important. It often takes many weeks to correct anemia. Depending on the cause, treatment can include:  Supplements of iron.  Vitamins B12 and folic acid.  Hormone medicines.If your anemia is due to bleeding, finding the cause of the blood loss is very important. This will help avoid further problems. SEEK IMMEDIATE MEDICAL CARE IF:   You develop fainting, extreme weakness, shortness of breath, or chest pain.  You develop heavy vaginal bleeding.  You develop bloody or black, tarry stools or vomit up blood.  You develop a high fever, rash, repeated vomiting, or dehydration. Document Released: 12/20/2004 Document Revised: 02/04/2012 Document Reviewed: 09/27/2009 [facility] Patient Information 2013 [place], [place].

## 2013-03-09 NOTE — Telephone Encounter (Signed)
gv and printed pt schedule and avs...pt aware

## 2013-03-09 NOTE — Progress Notes (Signed)
[facility] [phone]   [phone]  OFFICE PROGRESS NOTE  Sanjuana Letters, MD [address] [place] [address]  DIAGNOSIS: Metastatic non-small cell lung cancer, adenocarcinoma with negative EGFR mutation and positive BRAF mutation, diagnosed in June of 2010.   PRIOR THERAPY: Status post treatment with zelboraf 960 mg by mouth daily for less than 2 weeks at [facility] in [facility] in June of 2013 discontinued secondary to intolerance.   CURRENT THERAPY: Systemic chemotherapy with carboplatin for AUC of 5 and Alimta 500 mg/M2 every 3 weeks. Status post 3 cycles.   INTERVAL HISTORY: Shannon Camacho 79 y.o. female returns to the clinic today for followup visit accompanied her daughter. The patient is feeling fine today with no specific complaints except for mild fatigue. She is tolerating her current treatment with carboplatin and Alimta fairly well with no significant adverse effects. She denied having any significant nausea or vomiting. She denied having any fever or chills. The patient denied having any significant chest pain but continues to have shortness breath with exertion, no cough or hemoptysis. She had repeat CT scan of the chest, abdomen and pelvis performed recently and she is here today for evaluation and discussion of her scan results.  MEDICAL HISTORY: Past Medical History  Diagnosis Date  . Hypertension   . lung ca dx'd 04/2009    ALLERGIES:  is allergic to zelboraf.  MEDICATIONS:  Current Outpatient Prescriptions  Medication Sig Dispense Refill  . aspirin 81 MG tablet Take 81 mg by mouth daily.      . cholecalciferol (VITAMIN D) 400 UNITS TABS Take 400 Units by mouth daily.      . ciclopirox (LOPROX) 0.77 % cream Apply 1 application topically daily. Uses for feet.      Marland Kitchen dexamethasone (DECADRON) 4 MG tablet 4 mg by mouth twice a day the day before, day of and day after the  chemotherapy every 3 weeks with chemotherapy  40 tablet  0  . diclofenac sodium (VOLTAREN) 1 % GEL Apply 4 g topically 4 (four) times daily. Uses on knees  100 g  12  . folic acid (FOLVITE) 1 MG tablet Take 1 tablet (1 mg total) by mouth daily.  30 tablet  4  . hydrochlorothiazide (MICROZIDE) 12.5 MG capsule Take 1 capsule (12.5 mg total) by mouth daily.  30 capsule  0  . levothyroxine (SYNTHROID, LEVOTHROID) 88 MCG tablet Take 1 tablet (88 mcg total) by mouth daily.  30 tablet  0  . metoprolol tartrate (LOPRESSOR) 25 MG tablet Take 1 tablet (25 mg total) by mouth 2 (two) times daily.  60 tablet  0  . potassium chloride SA (K-DUR,KLOR-CON) 20 MEQ tablet Take 1 tablet (20 mEq total) by mouth as directed.  7 tablet  0  . pravastatin (PRAVACHOL) 40 MG tablet Take 1.5 tablets (60 mg total) by mouth daily.  45 tablet  0  . prochlorperazine (COMPAZINE) 10 MG tablet Take 1 tablet (10 mg total) by mouth every 6 (six) hours as needed.  60 tablet  0  . traMADol (ULTRAM) 50 MG tablet Take 50 mg by mouth every 6 (six) hours as needed.      . traZODone (DESYREL) 50 MG tablet Take 0.5-1 tablets (25-50 mg total) by mouth at bedtime as needed for sleep.  30 tablet  3   No current facility-administered medications for this visit.   REVIEW OF SYSTEMS:  A comprehensive review of  systems was negative except for: Constitutional: positive for fatigue Respiratory: positive for dyspnea on exertion   PHYSICAL EXAMINATION: General appearance: alert, cooperative and no distress Head: Normocephalic, without obvious abnormality, atraumatic Neck: no adenopathy Lymph nodes: Cervical, supraclavicular, and axillary nodes normal. Resp: clear to auscultation bilaterally Cardio: regular rate and rhythm, S1, S2 normal, no murmur, click, rub or gallop GI: soft, non-tender; bowel sounds normal; no masses,  no organomegaly Extremities: extremities normal, atraumatic, no cyanosis or edema Neurologic: Alert and oriented X 3, normal  strength and tone. Normal symmetric reflexes. Normal coordination and gait  ECOG PERFORMANCE STATUS: 1 - Symptomatic but completely ambulatory  Blood pressure 175/77, pulse 92, temperature 97.6 F (36.4 C), temperature source Oral, resp. rate 20, height 5\' 2"  (1.575 m), weight 224 lb (101.606 kg).  LABORATORY DATA: Lab Results  Component Value Date   WBC 7.3 03/09/2013   HGB 8.1* 03/09/2013   HCT 26.4* 03/09/2013   MCV 92.3 03/09/2013   PLT 427* 03/09/2013      Chemistry      Component Value Date/Time   NA 143 03/02/2013 0924   NA 143 12/03/2012 1653   K 3.1 Repeated and Verified* 03/02/2013 0924   K 3.8 12/03/2012 1653   CL 105 03/02/2013 0924   CL 109 12/03/2012 1653   CO2 27 03/02/2013 0924   CO2 27 12/03/2012 1653   BUN 15.2 03/02/2013 0924   BUN 21 12/03/2012 1653   CREATININE 1.1 03/02/2013 0924   CREATININE 0.91 12/03/2012 1653   CREATININE 1.10 09/12/2009 1316      Component Value Date/Time   CALCIUM 9.0 03/02/2013 0924   CALCIUM 9.1 12/03/2012 1653   ALKPHOS 101 03/02/2013 0924   ALKPHOS 86 12/03/2012 1653   AST 22 03/02/2013 0924   AST 20 12/03/2012 1653   ALT 23 03/02/2013 0924   ALT 15 12/03/2012 1653   BILITOT 0.40 03/02/2013 0924   BILITOT 0.3 12/03/2012 1653       RADIOGRAPHIC STUDIES: Ct Chest W Contrast  03/06/2013  *RADIOLOGY REPORT*  Clinical Data:  Lung cancer diagnosed in 2010.  Chemotherapy in progress.  Radiation therapy completed.  Persistent cough with abdominal pain, nausea and vomiting.  CT CHEST, ABDOMEN AND PELVIS WITH CONTRAST  Technique:  Multidetector CT imaging of the chest, abdomen and pelvis was performed following the standard protocol during bolus administration of intravenous contrast.  Contrast: OMNIPAQUE IOHEXOL 300 MG/ML  SOLN  Comparison:  Prior CTs 12/25/2012 and 09/12/2009.   CT CHEST  Findings:  Dominant central right upper lobe mass has not significantly changed, measuring approximately 9.2 x 5.3 cm on image 18.  There is probable mild postobstructive  pneumonitis peripheral to this mass.  There is a mixed solid and ground-glass lesion in the right lower lobe which measures 3.7 x 3.5 cm.  This has enlarged from 2010 and has developed more solid components.  A 2.2 cm subpleural nodule in the left lower lobe is unchanged from the most recent study.  There are multiple additional smaller nodules and ground-glass opacities in both lungs, not significantly changed over the last 10 weeks.  There is a stable 1.4 cm right paratracheal node on image 18.  A 1.4 cm subcarinal node on image 24 and mildly prominent right hilar lymph nodes are stable.  There is no progressive adenopathy.  There is no pleural or pericardial effusion.  Atherosclerosis of the aorta, coronary arteries and great vessels is noted.  There are no worrisome osseous findings.  IMPRESSION:  1.  Compared with the most recent study of 10 weeks prior, there has been no significant change in the large right upper lobe mass and the multiple ground-glass opacities and pulmonary nodules consistent with multifocal adenocarcinoma and/or metastatic disease. 2.  The mediastinal and right hilar adenopathy is also stable. 3.  No acute findings identified.   CT ABDOMEN AND PELVIS  Findings:  There is stable mild biliary dilatation status post cholecystectomy.  No focal liver lesions are identified.  A 2.5 x 2.1 cm left adrenal nodule is unchanged from 2010, likely an incidental adenoma.  There is no right adrenal nodule.  The spleen and pancreas appear normal.  There are small nonobstructing right renal calculi.  There is mild scarring in the right kidney.  There is no hydronephrosis.  Small low-density left renal lesions are stable.  The stomach, small bowel and colon demonstrate no significant findings.  The appendix appears normal.  There is a stable small paraumbilical hernia containing only fat.  There is stable aorto iliac atherosclerosis.  Retroverted uterus appears stable.  There is no adnexal mass.  There are  no worrisome osseous findings.  Mild degenerative changes are present in the lower lumbar spine.  IMPRESSION:  1.  No evidence of metastatic disease to the abdomen or pelvis. 2.  Stable left adrenal nodule, likely an incidental adenoma. 3.  Stable nonobstructing right renal calculi and scarring. 4.  No acute abdominal pelvic findings.   Original Report Authenticated By: Carey Bullocks, M.D.    ASSESSMENT: This is a very pleasant 79 years old African American female was metastatic non-small cell lung cancer currently undergoing systemic chemotherapy with carboplatin and Alimta status post 3 cycles with stable disease.   PLAN: I discussed the scan results with the patient and her daughter. I recommended for her to continue systemic chemotherapy with the same regimen carboplatin for AUC of 5 and Alimta 500 mg/M2 every 3 weeks. The patient receive cycle #4 today. She would come back for followup visit in 3 weeks with the next cycle of her chemotherapy. The patient has some questions about her insurance coverage for the chemotherapy and I referred her to the financial counselor. She was advised to call immediately if she has any concerning symptoms in the interval.  All questions were answered. The patient knows to call the clinic with any problems, questions or concerns. We can certainly see the patient much sooner if necessary.

## 2013-03-09 NOTE — Telephone Encounter (Signed)
Per staff message and POF I have scheduled appts.  JMW  

## 2013-03-09 NOTE — Progress Notes (Signed)
Patient came in with daughter. I gave EPP. Daughter will be calling to make sure both insurances were filed and paid. 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 She looks like 100%.

## 2013-03-11 ENCOUNTER — Ambulatory Visit (INDEPENDENT_AMBULATORY_CARE_PROVIDER_SITE_OTHER): Payer: PRIVATE HEALTH INSURANCE | Admitting: Family Medicine

## 2013-03-11 ENCOUNTER — Encounter: Payer: Self-pay | Admitting: Family Medicine

## 2013-03-11 VITALS — BP 128/62 | HR 73 | Temp 97.8°F | Ht 62.0 in | Wt 227.0 lb

## 2013-03-11 DIAGNOSIS — M1990 Unspecified osteoarthritis, unspecified site: Secondary | ICD-10-CM

## 2013-03-11 DIAGNOSIS — Z23 Encounter for immunization: Secondary | ICD-10-CM

## 2013-03-11 DIAGNOSIS — E785 Hyperlipidemia, unspecified: Secondary | ICD-10-CM

## 2013-03-11 DIAGNOSIS — D649 Anemia, unspecified: Secondary | ICD-10-CM

## 2013-03-11 DIAGNOSIS — E039 Hypothyroidism, unspecified: Secondary | ICD-10-CM

## 2013-03-11 DIAGNOSIS — I10 Essential (primary) hypertension: Secondary | ICD-10-CM

## 2013-03-11 DIAGNOSIS — I2510 Atherosclerotic heart disease of native coronary artery without angina pectoris: Secondary | ICD-10-CM

## 2013-03-11 MED ORDER — HYDROCHLOROTHIAZIDE 12.5 MG PO CAPS: 12.5000 mg | ORAL_CAPSULE | Freq: Every day | ORAL | Status: DC

## 2013-03-11 MED ORDER — TRAMADOL HCL 50 MG PO TABS: 50.0000 mg | ORAL_TABLET | Freq: Four times a day (QID) | ORAL | Status: DC | PRN

## 2013-03-11 MED ORDER — PRAVASTATIN SODIUM 40 MG PO TABS: 60.0000 mg | ORAL_TABLET | Freq: Every day | ORAL | Status: DC

## 2013-03-11 MED ORDER — METOPROLOL TARTRATE 25 MG PO TABS: 25.0000 mg | ORAL_TABLET | Freq: Two times a day (BID) | ORAL | Status: DC

## 2013-03-11 MED ORDER — LEVOTHYROXINE SODIUM 88 MCG PO TABS: 88.0000 ug | ORAL_TABLET | Freq: Every day | ORAL | Status: DC

## 2013-03-11 NOTE — Patient Instructions (Addendum)
You look good. I don't need any blood work today. Keep seeing the cancer doctor. I will see you in four months.  Sooner if there is a problem Try to get exercising a bit more.

## 2013-03-12 ENCOUNTER — Telehealth: Payer: Self-pay | Admitting: Family Medicine

## 2013-03-12 DIAGNOSIS — D649 Anemia, unspecified: Secondary | ICD-10-CM | POA: Insufficient documentation

## 2013-03-12 HISTORY — DX: Anemia, unspecified: D64.9

## 2013-03-12 NOTE — Progress Notes (Signed)
  Subjective:    Patient ID: Shannon Camacho, female    DOB: 02/28/1928, 79 y.o.   MRN: [id number]  HPI  Doing well.  Receiving chemo.  Tolerating.  BP is good.  Last labs were OK.  Note dropping hemoglobin with chemo followed by onc. Needs tetanus booster. Has had normal colonoscopy x2 in the past - apparently had trouble seeing the cecum.  Given age and co morbid problems, will not consider her for further screening colonoscopy.      Review of Systems     Objective:   Physical Exam Lungs clear Caridac RRR       Assessment & Plan:

## 2013-03-12 NOTE — Assessment & Plan Note (Signed)
Seems directly related to chemo.  Followed by heme/onc

## 2013-03-12 NOTE — Assessment & Plan Note (Signed)
Stable, meds refilled

## 2013-03-12 NOTE — Telephone Encounter (Signed)
She was just in yesterday.  I will be happy to fill out a form on Monday.

## 2013-03-12 NOTE — Assessment & Plan Note (Signed)
Stable on meds which were refilled

## 2013-03-12 NOTE — Telephone Encounter (Signed)
The patients daughter is calling because she isn't sure what she needs to do to get a handicap plackard to tote her mom around so she thought starting with Dr. Leveda Anna would be a good place to start.

## 2013-03-12 NOTE — Assessment & Plan Note (Signed)
No chest pain despite anemia.

## 2013-03-16 ENCOUNTER — Encounter: Payer: Self-pay | Admitting: Internal Medicine

## 2013-03-16 ENCOUNTER — Other Ambulatory Visit (HOSPITAL_BASED_OUTPATIENT_CLINIC_OR_DEPARTMENT_OTHER): Payer: PRIVATE HEALTH INSURANCE

## 2013-03-16 ENCOUNTER — Telehealth: Payer: Self-pay | Admitting: *Deleted

## 2013-03-16 DIAGNOSIS — C3410 Malignant neoplasm of upper lobe, unspecified bronchus or lung: Secondary | ICD-10-CM

## 2013-03-16 DIAGNOSIS — C3490 Malignant neoplasm of unspecified part of unspecified bronchus or lung: Secondary | ICD-10-CM

## 2013-03-16 LAB — COMPREHENSIVE METABOLIC PANEL (CC13)
ALT: 51 U/L (ref 0–55)
Alkaline Phosphatase: 89 U/L (ref 40–150)
CO2: 24 mEq/L (ref 22–29)
Creatinine: 1.2 mg/dL — ABNORMAL HIGH (ref 0.6–1.1)
Glucose: 131 mg/dl — ABNORMAL HIGH (ref 70–99)
Sodium: 138 mEq/L (ref 136–145)
Total Bilirubin: 0.54 mg/dL (ref 0.20–1.20)
Total Protein: 7 g/dL (ref 6.4–8.3)

## 2013-03-16 LAB — CBC WITH DIFFERENTIAL/PLATELET
BASO%: 0.9 % (ref 0.0–2.0)
HCT: 27.7 % — ABNORMAL LOW (ref 34.8–46.6)
LYMPH%: 40.3 % (ref 14.0–49.7)
MCHC: 30.7 g/dL — ABNORMAL LOW (ref 31.5–36.0)
MCV: 90.2 fL (ref 79.5–101.0)
MONO#: 0.3 10*3/uL (ref 0.1–0.9)
MONO%: 15.3 % — ABNORMAL HIGH (ref 0.0–14.0)
NEUT%: 39.3 % (ref 38.4–76.8)
Platelets: 308 10*3/uL (ref 145–400)
RBC: 3.07 10*6/uL — ABNORMAL LOW (ref 3.70–5.45)

## 2013-03-16 NOTE — Progress Notes (Signed)
100%ind 03/16/13-09/15/13. I will send letter and card and all documents are scanned in.

## 2013-03-16 NOTE — Telephone Encounter (Signed)
Called patient to inform that form was filled out by Dr Leveda Anna and left upfront,she states she'll stop by tomorrow to pick it up. Shannon Camacho, Shannon Camacho close note.thank you.Shannon Camacho, Shannon Camacho

## 2013-03-16 NOTE — Telephone Encounter (Signed)
Completed my portion of the form.

## 2013-03-23 ENCOUNTER — Telehealth: Payer: Self-pay | Admitting: Medical Oncology

## 2013-03-23 ENCOUNTER — Other Ambulatory Visit: Payer: Self-pay | Admitting: Medical Oncology

## 2013-03-23 ENCOUNTER — Other Ambulatory Visit (HOSPITAL_BASED_OUTPATIENT_CLINIC_OR_DEPARTMENT_OTHER): Payer: PRIVATE HEALTH INSURANCE | Admitting: Lab

## 2013-03-23 DIAGNOSIS — C3410 Malignant neoplasm of upper lobe, unspecified bronchus or lung: Secondary | ICD-10-CM

## 2013-03-23 DIAGNOSIS — D649 Anemia, unspecified: Secondary | ICD-10-CM

## 2013-03-23 DIAGNOSIS — C3490 Malignant neoplasm of unspecified part of unspecified bronchus or lung: Secondary | ICD-10-CM

## 2013-03-23 LAB — COMPREHENSIVE METABOLIC PANEL (CC13)
ALT: 20 U/L (ref 0–55)
BUN: 25.6 mg/dL (ref 7.0–26.0)
CO2: 25 mEq/L (ref 22–29)
Calcium: 9 mg/dL (ref 8.4–10.4)
Chloride: 106 mEq/L (ref 98–107)
Creatinine: 1.2 mg/dL — ABNORMAL HIGH (ref 0.6–1.1)
Glucose: 105 mg/dl — ABNORMAL HIGH (ref 70–99)
Total Bilirubin: 0.25 mg/dL (ref 0.20–1.20)

## 2013-03-23 LAB — CBC WITH DIFFERENTIAL/PLATELET
Eosinophils Absolute: 0.2 10*3/uL (ref 0.0–0.5)
HCT: 22.6 % — ABNORMAL LOW (ref 34.8–46.6)
LYMPH%: 29.4 % (ref 14.0–49.7)
MCV: 90.8 fL (ref 79.5–101.0)
MONO#: 0.8 10*3/uL (ref 0.1–0.9)
MONO%: 17.7 % — ABNORMAL HIGH (ref 0.0–14.0)
NEUT#: 2.1 10*3/uL (ref 1.5–6.5)
NEUT%: 48.5 % (ref 38.4–76.8)
Platelets: 136 10*3/uL — ABNORMAL LOW (ref 145–400)
WBC: 4.4 10*3/uL (ref 3.9–10.3)

## 2013-03-23 NOTE — Progress Notes (Signed)
Quick Note:  Call patient with the result and arrange for 2 units of PRBCs transfusion. ______ 

## 2013-03-23 NOTE — Telephone Encounter (Signed)
Unable to reach pt about low hgb. Her daughter called me back and pt is agreeable to take blood transfusion. Pt says says she feels better than she did 2 weeks ago but does report fatigue.

## 2013-03-23 NOTE — Telephone Encounter (Signed)
Type and cross ordered. Daughter notified of appts .

## 2013-03-24 ENCOUNTER — Encounter (HOSPITAL_COMMUNITY): Payer: PRIVATE HEALTH INSURANCE

## 2013-03-24 ENCOUNTER — Telehealth: Payer: Self-pay | Admitting: Internal Medicine

## 2013-03-24 ENCOUNTER — Other Ambulatory Visit: Payer: PRIVATE HEALTH INSURANCE | Admitting: Lab

## 2013-03-24 ENCOUNTER — Encounter (HOSPITAL_COMMUNITY)
Admission: RE | Admit: 2013-03-24 | Discharge: 2013-03-24 | Disposition: A | Discharge status: Home / Self Care | Payer: PRIVATE HEALTH INSURANCE | Source: Ambulatory Visit | Attending: Internal Medicine | Admitting: Internal Medicine

## 2013-03-24 ENCOUNTER — Ambulatory Visit (HOSPITAL_BASED_OUTPATIENT_CLINIC_OR_DEPARTMENT_OTHER): Payer: PRIVATE HEALTH INSURANCE

## 2013-03-24 VITALS — BP 127/73 | HR 71 | Temp 97.3°F | Resp 18

## 2013-03-24 DIAGNOSIS — D649 Anemia, unspecified: Secondary | ICD-10-CM | POA: Insufficient documentation

## 2013-03-24 LAB — ABO/RH: ABO/RH(D): O POS

## 2013-03-24 MED ORDER — DIPHENHYDRAMINE HCL 50 MG/ML IJ SOLN
25.0000 mg | Freq: Once | INTRAMUSCULAR | Status: AC
  Administered 2013-03-24: 25 mg via INTRAVENOUS

## 2013-03-24 MED ORDER — SODIUM CHLORIDE 0.9 % IV SOLN: 250.0000 mL | Freq: Once | INTRAVENOUS | Status: DC

## 2013-03-24 MED ORDER — ACETAMINOPHEN 325 MG PO TABS
650.0000 mg | ORAL_TABLET | Freq: Once | ORAL | Status: AC
  Administered 2013-03-24: 650 mg via ORAL

## 2013-03-24 NOTE — Telephone Encounter (Signed)
appts for lb/blood per 4/28 pof already scheduled. Per pof pt dtr aware of appts.

## 2013-03-24 NOTE — Patient Instructions (Addendum)
Blood Products Information This is information about transfusions of blood products. All blood that is to be transfused is tested for blood type, compatibility with the recipient, and for infections. Except in emergencies, giving a transfusion requires a written consent. Blood transfusions are often given as packed red blood cells. This means the other parts of the blood have been taken out. Blood may be needed to treat severe anemia or bleeding. Other blood products include plasma, platelets, immune globulin, and cryoprecipitate. Blood for transfusion is mostly donated by volunteers. The blood donors are carefully screened for risk factors that could cause disease. Donors are all tested for infections that could be transmitted by blood. The blood product supply today is the safest it has ever been. Some risks do remain.  A minor reaction with fever, chills, or rash happens in about 1% of blood product transfusions.  Life-threatening reactions occur in less than 1 in a million transfusions.  Infection with germs (bacteria), viruses or parasites like malaria can still happen. The risk is very low.  Hepatitis B occurs in about 1 case in 150,000 transfusions.  Hepatitis C is seen once in 500,000.  HIV is transmitted less than once every million transfusions. When you receive a transfusion of packed red blood cells, your blood is tested for blood group and Rh type. Your blood is also screened for antibodies that could cause a serious reaction. A cross-match test is done to make sure the blood is safe to give.  Talk with your caregiver if you have any concerns about receiving a transfusion of blood products. Make sure your questions are answered. Transfusions are not given if your caregiver feels the risk is greater than the need. Document Released: 11/12/2005 Document Revised: 02/04/2012 Document Reviewed: 05/02/2007 ExitCare Patient Information 2013 [employer].  

## 2013-03-25 ENCOUNTER — Other Ambulatory Visit: Payer: Self-pay | Admitting: *Deleted

## 2013-03-25 ENCOUNTER — Ambulatory Visit (HOSPITAL_BASED_OUTPATIENT_CLINIC_OR_DEPARTMENT_OTHER): Payer: PRIVATE HEALTH INSURANCE

## 2013-03-25 VITALS — BP 122/63 | HR 72 | Temp 96.9°F | Resp 18

## 2013-03-25 DIAGNOSIS — D649 Anemia, unspecified: Secondary | ICD-10-CM

## 2013-03-25 MED ORDER — ACETAMINOPHEN 325 MG PO TABS
650.0000 mg | ORAL_TABLET | Freq: Once | ORAL | Status: AC
  Administered 2013-03-25: 650 mg via ORAL

## 2013-03-25 MED ORDER — DIPHENHYDRAMINE HCL 50 MG/ML IJ SOLN
25.0000 mg | Freq: Once | INTRAMUSCULAR | Status: AC
  Administered 2013-03-25: 25 mg via INTRAVENOUS

## 2013-03-25 MED ORDER — SODIUM CHLORIDE 0.9 % IV SOLN
250.0000 mL | Freq: Once | INTRAVENOUS | Status: AC
  Administered 2013-03-25: 250 mL via INTRAVENOUS

## 2013-03-25 NOTE — Patient Instructions (Signed)
Blood Transfusion Information WHAT IS A BLOOD TRANSFUSION? A transfusion is the replacement of blood or some of its parts. Blood is made up of multiple cells which provide different functions.  Red blood cells carry oxygen and are used for blood loss replacement.  White blood cells fight against infection.  Platelets control bleeding.  Plasma helps clot blood.  Other blood products are available for specialized needs, such as hemophilia or other clotting disorders. BEFORE THE TRANSFUSION  Who gives blood for transfusions?   You may be able to donate blood to be used at a later date on yourself (autologous donation).  Relatives can be asked to donate blood. This is generally not any safer than if you have received blood from a stranger. The same precautions are taken to ensure safety when a relative's blood is donated.  Healthy volunteers who are fully evaluated to make sure their blood is safe. This is blood bank blood. Transfusion therapy is the safest it has ever been in the practice of medicine. Before blood is taken from a donor, a complete history is taken to make sure that person has no history of diseases nor engages in risky social behavior (examples are intravenous drug use or sexual activity with multiple partners). The donor's travel history is screened to minimize risk of transmitting infections, such as malaria. The donated blood is tested for signs of infectious diseases, such as HIV and hepatitis. The blood is then tested to be sure it is compatible with you in order to minimize the chance of a transfusion reaction. If you or a relative donates blood, this is often done in anticipation of surgery and is not appropriate for emergency situations. It takes many days to process the donated blood. RISKS AND COMPLICATIONS Although transfusion therapy is very safe and saves many lives, the main dangers of transfusion include:   Getting an infectious disease.  Developing a  transfusion reaction. This is an allergic reaction to something in the blood you were given. Every precaution is taken to prevent this. The decision to have a blood transfusion has been considered carefully by your caregiver before blood is given. Blood is not given unless the benefits outweigh the risks. AFTER THE TRANSFUSION  Right after receiving a blood transfusion, you will usually feel much better and more energetic. This is especially true if your red blood cells have gotten low (anemic). The transfusion raises the level of the red blood cells which carry oxygen, and this usually causes an energy increase.  The nurse administering the transfusion will monitor you carefully for complications. HOME CARE INSTRUCTIONS  No special instructions are needed after a transfusion. You may find your energy is better. Speak with your caregiver about any limitations on activity for underlying diseases you may have. SEEK MEDICAL CARE IF:   Your condition is not improving after your transfusion.  You develop redness or irritation at the intravenous (IV) site. SEEK IMMEDIATE MEDICAL CARE IF:  Any of the following symptoms occur over the next 12 hours:  Shaking chills.  You have a temperature by mouth above 102 F (38.9 C), not controlled by medicine.  Chest, back, or muscle pain.  People around you feel you are not acting correctly or are confused.  Shortness of breath or difficulty breathing.  Dizziness and fainting.  You get a rash or develop hives.  You have a decrease in urine output.  Your urine turns a dark color or changes to pink, red, or brown. Any of the following   symptoms occur over the next 10 days:  You have a temperature by mouth above 102 F (38.9 C), not controlled by medicine.  Shortness of breath.  Weakness after normal activity.  The white part of the eye turns yellow (jaundice).  You have a decrease in the amount of urine or are urinating less often.  Your  urine turns a dark color or changes to pink, red, or brown. Document Released: 11/09/2000 Document Revised: 02/04/2012 Document Reviewed: 06/28/2008 ExitCare Patient Information 2013 [employer].  

## 2013-03-25 NOTE — Progress Notes (Signed)
Patient discharged with dtr.  Via wheelchair.

## 2013-03-26 LAB — TYPE AND SCREEN
ABO/RH(D): O POS
Unit division: 0

## 2013-03-30 ENCOUNTER — Encounter: Payer: Self-pay | Admitting: Internal Medicine

## 2013-03-30 ENCOUNTER — Telehealth: Payer: Self-pay | Admitting: Internal Medicine

## 2013-03-30 ENCOUNTER — Ambulatory Visit (HOSPITAL_BASED_OUTPATIENT_CLINIC_OR_DEPARTMENT_OTHER): Payer: PRIVATE HEALTH INSURANCE

## 2013-03-30 ENCOUNTER — Ambulatory Visit (HOSPITAL_BASED_OUTPATIENT_CLINIC_OR_DEPARTMENT_OTHER): Payer: PRIVATE HEALTH INSURANCE | Admitting: Internal Medicine

## 2013-03-30 ENCOUNTER — Other Ambulatory Visit (HOSPITAL_BASED_OUTPATIENT_CLINIC_OR_DEPARTMENT_OTHER): Payer: PRIVATE HEALTH INSURANCE | Admitting: Lab

## 2013-03-30 VITALS — BP 156/88 | HR 74 | Temp 97.4°F | Resp 18 | Ht 62.0 in | Wt 222.7 lb

## 2013-03-30 DIAGNOSIS — C3410 Malignant neoplasm of upper lobe, unspecified bronchus or lung: Secondary | ICD-10-CM

## 2013-03-30 DIAGNOSIS — Z5111 Encounter for antineoplastic chemotherapy: Secondary | ICD-10-CM

## 2013-03-30 DIAGNOSIS — R0602 Shortness of breath: Secondary | ICD-10-CM

## 2013-03-30 DIAGNOSIS — C3490 Malignant neoplasm of unspecified part of unspecified bronchus or lung: Secondary | ICD-10-CM

## 2013-03-30 DIAGNOSIS — R5383 Other fatigue: Secondary | ICD-10-CM

## 2013-03-30 DIAGNOSIS — D6481 Anemia due to antineoplastic chemotherapy: Secondary | ICD-10-CM

## 2013-03-30 LAB — COMPREHENSIVE METABOLIC PANEL (CC13)
Albumin: 3.9 g/dL (ref 3.5–5.0)
BUN: 19.8 mg/dL (ref 7.0–26.0)
CO2: 26 mEq/L (ref 22–29)
Calcium: 9.5 mg/dL (ref 8.4–10.4)
Chloride: 104 mEq/L (ref 98–107)
Glucose: 127 mg/dl — ABNORMAL HIGH (ref 70–99)
Potassium: 3.7 mEq/L (ref 3.5–5.1)
Sodium: 141 mEq/L (ref 136–145)
Total Protein: 7.8 g/dL (ref 6.4–8.3)

## 2013-03-30 LAB — CBC WITH DIFFERENTIAL/PLATELET
Basophils Absolute: 0 10*3/uL (ref 0.0–0.1)
Eosinophils Absolute: 0 10*3/uL (ref 0.0–0.5)
HCT: 34.6 % — ABNORMAL LOW (ref 34.8–46.6)
HGB: 11.1 g/dL — ABNORMAL LOW (ref 11.6–15.9)
MCH: 28.3 pg (ref 25.1–34.0)
MONO#: 0.6 10*3/uL (ref 0.1–0.9)
NEUT#: 5.6 10*3/uL (ref 1.5–6.5)
NEUT%: 81.6 % — ABNORMAL HIGH (ref 38.4–76.8)
RDW: 17.3 % — ABNORMAL HIGH (ref 11.2–14.5)
WBC: 6.8 10*3/uL (ref 3.9–10.3)
lymph#: 0.7 10*3/uL — ABNORMAL LOW (ref 0.9–3.3)

## 2013-03-30 MED ORDER — SODIUM CHLORIDE 0.9 % IV SOLN
500.0000 mg/m2 | Freq: Once | INTRAVENOUS | Status: AC
  Administered 2013-03-30: 1050 mg via INTRAVENOUS
  Filled 2013-03-30: qty 42

## 2013-03-30 MED ORDER — SODIUM CHLORIDE 0.9 % IV SOLN
438.0000 mg | Freq: Once | INTRAVENOUS | Status: AC
  Administered 2013-03-30: 440 mg via INTRAVENOUS
  Filled 2013-03-30: qty 44

## 2013-03-30 MED ORDER — DEXAMETHASONE SODIUM PHOSPHATE 20 MG/5ML IJ SOLN
20.0000 mg | Freq: Once | INTRAMUSCULAR | Status: AC
  Administered 2013-03-30: 20 mg via INTRAVENOUS

## 2013-03-30 MED ORDER — ONDANSETRON 16 MG/50ML IVPB (CHCC)
16.0000 mg | Freq: Once | INTRAVENOUS | Status: AC
  Administered 2013-03-30: 16 mg via INTRAVENOUS

## 2013-03-30 MED ORDER — SODIUM CHLORIDE 0.9 % IV SOLN
Freq: Once | INTRAVENOUS | Status: AC
  Administered 2013-03-30: 14:00:00 via INTRAVENOUS

## 2013-03-30 NOTE — Patient Instructions (Signed)
[facility] Discharge Instructions for Patients Receiving Chemotherapy  Today you received the following chemotherapy agents Alimta, Carboplatin  To help prevent nausea and vomiting after your treatment, we encourage you to take your nausea medication Prochlorperazine 10 mg. Begin taking it at anytime upon discharge and take it as often as prescribed By Dr. Arbutus Ped for the next 72 hours.   If you develop nausea and vomiting that is not controlled by your nausea medication, call the clinic. If it is after clinic hours your family physician or the after hours number for the clinic or go to the Emergency Department.   BELOW ARE SYMPTOMS THAT SHOULD BE REPORTED IMMEDIATELY:  *FEVER GREATER THAN 100.5 F  *CHILLS WITH OR WITHOUT FEVER  NAUSEA AND VOMITING THAT IS NOT CONTROLLED WITH YOUR NAUSEA MEDICATION  *UNUSUAL SHORTNESS OF BREATH  *UNUSUAL BRUISING OR BLEEDING  TENDERNESS IN MOUTH AND THROAT WITH OR WITHOUT PRESENCE OF ULCERS  *URINARY PROBLEMS  *BOWEL PROBLEMS  UNUSUAL RASH Items with * indicate a potential emergency and should be followed up as soon as possible.  Please call to let a nurse know about any problems that you may have experienced. Feel free to call the clinic you have any questions or concerns. The clinic phone number is [phone].   I have been informed and understand all the instructions given to me. I know to contact the clinic, my physician, or go to the Emergency Department if any problems should occur. I do not have any questions at this time, but understand that I may call the clinic during office hours   should I have any questions or need assistance in obtaining follow up care.    __________________________________________  _____________  __________ Signature of Patient or Authorized Representative            Date                   Time    __________________________________________ Nurse's Signature

## 2013-03-30 NOTE — Patient Instructions (Signed)
Continue chemotherapy today as scheduled  Followup in 3 weeks. 

## 2013-03-30 NOTE — Telephone Encounter (Signed)
gv and printed appt sched and avs for pt  °

## 2013-03-30 NOTE — Progress Notes (Signed)
[facility] [phone]   [phone]  OFFICE PROGRESS NOTE  Sanjuana Letters, MD [address] [place] [address]  DIAGNOSIS: Metastatic non-small cell lung cancer, adenocarcinoma with negative EGFR mutation and positive BRAF mutation, diagnosed in June of 2010.   PRIOR THERAPY: Status post treatment with zelboraf 960 mg by mouth daily for less than 2 weeks at [facility] in [facility] in June of 2013 discontinued secondary to intolerance.   CURRENT THERAPY: Systemic chemotherapy with carboplatin for AUC of 5 and Alimta 500 mg/M2 every 3 weeks. Status post 4 cycles.   INTERVAL HISTORY: Shannon Camacho 79 y.o. female returns to the clinic today for followup visit accompanied her daughter. The patient is feeling fine today with no specific complaints except for mild fatigue. She tolerated the last cycle of her systemic chemotherapy with carboplatin and Alimta fairly well. She required 2 units of packed rbc's transfusion after the last cycle secondary to chemotherapy-induced anemia. Her hemoglobin today has improved significantly. The patient denied having any significant chest pain but continues to have shortness breath with exertion, no cough or hemoptysis. She denied having any significant weight loss or night sweats.  MEDICAL HISTORY: Past Medical History  Diagnosis Date  . Hypertension   . lung ca dx'd 04/2009    ALLERGIES:  is allergic to zelboraf.  MEDICATIONS:  Current Outpatient Prescriptions  Medication Sig Dispense Refill  . aspirin 81 MG tablet Take 81 mg by mouth daily.      . cholecalciferol (VITAMIN D) 400 UNITS TABS Take 400 Units by mouth daily.      . ciclopirox (LOPROX) 0.77 % cream Apply 1 application topically daily. Uses for feet.      Marland Kitchen dexamethasone (DECADRON) 4 MG tablet 4 mg by mouth twice a day the day before, day of and day after the chemotherapy every 3 weeks with  chemotherapy  40 tablet  0  . folic acid (FOLVITE) 1 MG tablet Take 1 tablet (1 mg total) by mouth daily.  30 tablet  4  . hydrochlorothiazide (MICROZIDE) 12.5 MG capsule Take 1 capsule (12.5 mg total) by mouth daily.  90 capsule  3  . levothyroxine (SYNTHROID, LEVOTHROID) 88 MCG tablet Take 1 tablet (88 mcg total) by mouth daily.  90 tablet  3  . metoprolol tartrate (LOPRESSOR) 25 MG tablet Take 1 tablet (25 mg total) by mouth 2 (two) times daily.  180 tablet  3  . potassium chloride SA (K-DUR,KLOR-CON) 20 MEQ tablet Take 1 tablet (20 mEq total) by mouth as directed.  7 tablet  0  . pravastatin (PRAVACHOL) 40 MG tablet Take 1.5 tablets (60 mg total) by mouth daily.  135 tablet  3  . prochlorperazine (COMPAZINE) 10 MG tablet Take 1 tablet (10 mg total) by mouth every 6 (six) hours as needed.  60 tablet  0  . traMADol (ULTRAM) 50 MG tablet Take 1 tablet (50 mg total) by mouth every 6 (six) hours as needed.  90 tablet  3  . traZODone (DESYREL) 50 MG tablet Take 0.5-1 tablets (25-50 mg total) by mouth at bedtime as needed for sleep.  30 tablet  3  . diclofenac sodium (VOLTAREN) 1 % GEL Apply 4 g topically 4 (four) times daily. Uses on knees  100 g  12   No current facility-administered medications for this visit.    REVIEW OF SYSTEMS:  A comprehensive review of systems was negative except for: Constitutional:  positive for fatigue Respiratory: positive for dyspnea on exertion   PHYSICAL EXAMINATION: General appearance: alert, cooperative and no distress Head: Normocephalic, without obvious abnormality, atraumatic Neck: no adenopathy Lymph nodes: Cervical, supraclavicular, and axillary nodes normal. Resp: clear to auscultation bilaterally Cardio: regular rate and rhythm, S1, S2 normal, no murmur, click, rub or gallop GI: soft, non-tender; bowel sounds normal; no masses,  no organomegaly Extremities: extremities normal, atraumatic, no cyanosis or edema Neurologic: Alert and oriented X 3, normal  strength and tone. Normal symmetric reflexes. Normal coordination and gait  ECOG PERFORMANCE STATUS: 1 - Symptomatic but completely ambulatory  Blood pressure 156/88, pulse 74, temperature 97.4 F (36.3 C), temperature source Oral, resp. rate 18, height 5\' 2"  (1.575 m), weight 222 lb 11.2 oz (101.016 kg).  LABORATORY DATA: Lab Results  Component Value Date   WBC 6.8 03/30/2013   HGB 11.1* 03/30/2013   HCT 34.6* 03/30/2013   MCV 88.3 03/30/2013   PLT 300 03/30/2013      Chemistry      Component Value Date/Time   NA 143 03/23/2013 0905   NA 143 12/03/2012 1653   K 3.6 03/23/2013 0905   K 3.8 12/03/2012 1653   CL 106 03/23/2013 0905   CL 109 12/03/2012 1653   CO2 25 03/23/2013 0905   CO2 27 12/03/2012 1653   BUN 25.6 03/23/2013 0905   BUN 21 12/03/2012 1653   CREATININE 1.2* 03/23/2013 0905   CREATININE 0.91 12/03/2012 1653   CREATININE 1.10 09/12/2009 1316      Component Value Date/Time   CALCIUM 9.0 03/23/2013 0905   CALCIUM 9.1 12/03/2012 1653   ALKPHOS 90 03/23/2013 0905   ALKPHOS 86 12/03/2012 1653   AST 21 03/23/2013 0905   AST 20 12/03/2012 1653   ALT 20 03/23/2013 0905   ALT 15 12/03/2012 1653   BILITOT 0.25 03/23/2013 0905   BILITOT 0.3 12/03/2012 1653       ASSESSMENT: This is a very pleasant 79 years old [place] American female with metastatic non-small cell lung cancer currently undergoing systemic chemotherapy with carboplatin and Alimta status post 4 cycles.    PLAN: She is tolerating her treatment fairly well with no significant adverse effects except for mild fatigue and shortness breath with exertion. She denied having any nausea or vomiting. I recommended for the patient to proceed with the current treatment as scheduled and she will receive cycle #5 today.  She would come back for followup visit in 3 weeks with the next cycle of her chemotherapy.  She was advised to call immediately if she has any concerning symptoms in the interval.  All questions were answered. The patient knows to  call the clinic with any problems, questions or concerns. We can certainly see the patient much sooner if necessary.  I spent 15 minutes counseling the patient face to face. The total time spent in the appointment was 25 minutes.

## 2013-04-06 ENCOUNTER — Telehealth: Payer: Self-pay | Admitting: Medical Oncology

## 2013-04-06 ENCOUNTER — Other Ambulatory Visit (HOSPITAL_BASED_OUTPATIENT_CLINIC_OR_DEPARTMENT_OTHER): Payer: PRIVATE HEALTH INSURANCE | Admitting: Lab

## 2013-04-06 DIAGNOSIS — C3410 Malignant neoplasm of upper lobe, unspecified bronchus or lung: Secondary | ICD-10-CM

## 2013-04-06 DIAGNOSIS — D649 Anemia, unspecified: Secondary | ICD-10-CM

## 2013-04-06 DIAGNOSIS — C3490 Malignant neoplasm of unspecified part of unspecified bronchus or lung: Secondary | ICD-10-CM

## 2013-04-06 DIAGNOSIS — E876 Hypokalemia: Secondary | ICD-10-CM

## 2013-04-06 LAB — CBC WITH DIFFERENTIAL/PLATELET
BASO%: 1.4 % (ref 0.0–2.0)
LYMPH%: 33.3 % (ref 14.0–49.7)
MCHC: 32.5 g/dL (ref 31.5–36.0)
MCV: 87.7 fL (ref 79.5–101.0)
MONO#: 0.2 10*3/uL (ref 0.1–0.9)
MONO%: 13 % (ref 0.0–14.0)
Platelets: 166 10*3/uL (ref 145–400)
RBC: 3.81 10*6/uL (ref 3.70–5.45)
RDW: 17.8 % — ABNORMAL HIGH (ref 11.2–14.5)
WBC: 1.9 10*3/uL — ABNORMAL LOW (ref 3.9–10.3)

## 2013-04-06 LAB — COMPREHENSIVE METABOLIC PANEL (CC13)
AST: 40 U/L — ABNORMAL HIGH (ref 5–34)
Albumin: 3.6 g/dL (ref 3.5–5.0)
Alkaline Phosphatase: 97 U/L (ref 40–150)
BUN: 21.1 mg/dL (ref 7.0–26.0)
Potassium: 3.2 mEq/L — ABNORMAL LOW (ref 3.5–5.1)
Sodium: 140 mEq/L (ref 136–145)
Total Protein: 7.2 g/dL (ref 6.4–8.3)

## 2013-04-06 MED ORDER — POTASSIUM CHLORIDE CRYS ER 20 MEQ PO TBCR: 20.0000 meq | EXTENDED_RELEASE_TABLET | ORAL | Status: DC

## 2013-04-06 NOTE — Telephone Encounter (Signed)
Message copied by Charma Igo on Mon Apr 06, 2013 10:32 AM ------      Message from: Si Gaul      Created: Mon Apr 06, 2013  9:46 AM       Call patient with the result and order K Dur 20 meq po qd X 7 ------

## 2013-04-06 NOTE — Progress Notes (Signed)
Quick Note:  Call patient with the result and order K Dur 20 meq po qd X 7 ______ 

## 2013-04-06 NOTE — Telephone Encounter (Signed)
called in refill to pharmacy and pt voice mail.

## 2013-04-10 ENCOUNTER — Encounter: Payer: Self-pay | Admitting: Family Medicine

## 2013-04-10 DIAGNOSIS — H9110 Presbycusis, unspecified ear: Secondary | ICD-10-CM | POA: Insufficient documentation

## 2013-04-10 HISTORY — DX: Presbycusis, unspecified ear: H91.10

## 2013-04-13 ENCOUNTER — Other Ambulatory Visit (HOSPITAL_BASED_OUTPATIENT_CLINIC_OR_DEPARTMENT_OTHER): Payer: PRIVATE HEALTH INSURANCE | Admitting: Lab

## 2013-04-13 DIAGNOSIS — C3490 Malignant neoplasm of unspecified part of unspecified bronchus or lung: Secondary | ICD-10-CM

## 2013-04-13 DIAGNOSIS — C3410 Malignant neoplasm of upper lobe, unspecified bronchus or lung: Secondary | ICD-10-CM

## 2013-04-13 LAB — COMPREHENSIVE METABOLIC PANEL (CC13)
Alkaline Phosphatase: 86 U/L (ref 40–150)
BUN: 13.8 mg/dL (ref 7.0–26.0)
Creatinine: 1.1 mg/dL (ref 0.6–1.1)
Glucose: 130 mg/dl — ABNORMAL HIGH (ref 70–99)
Sodium: 140 mEq/L (ref 136–145)
Total Bilirubin: 0.54 mg/dL (ref 0.20–1.20)
Total Protein: 7.8 g/dL (ref 6.4–8.3)

## 2013-04-13 LAB — CBC WITH DIFFERENTIAL/PLATELET
Basophils Absolute: 0 10*3/uL (ref 0.0–0.1)
EOS%: 0.9 % (ref 0.0–7.0)
HGB: 7.9 g/dL — ABNORMAL LOW (ref 11.6–15.9)
MCH: 27.4 pg (ref 25.1–34.0)
MCV: 89.6 fL (ref 79.5–101.0)
MONO%: 21.9 % — ABNORMAL HIGH (ref 0.0–14.0)
RDW: 18.4 % — ABNORMAL HIGH (ref 11.2–14.5)

## 2013-04-17 ENCOUNTER — Encounter: Payer: Self-pay | Admitting: Pharmacist

## 2013-04-21 ENCOUNTER — Encounter: Payer: Self-pay | Admitting: Internal Medicine

## 2013-04-21 ENCOUNTER — Other Ambulatory Visit (HOSPITAL_BASED_OUTPATIENT_CLINIC_OR_DEPARTMENT_OTHER): Payer: PRIVATE HEALTH INSURANCE | Admitting: Lab

## 2013-04-21 ENCOUNTER — Ambulatory Visit (HOSPITAL_BASED_OUTPATIENT_CLINIC_OR_DEPARTMENT_OTHER): Payer: PRIVATE HEALTH INSURANCE | Admitting: Internal Medicine

## 2013-04-21 ENCOUNTER — Ambulatory Visit (HOSPITAL_BASED_OUTPATIENT_CLINIC_OR_DEPARTMENT_OTHER): Payer: PRIVATE HEALTH INSURANCE

## 2013-04-21 ENCOUNTER — Telehealth: Payer: Self-pay | Admitting: Internal Medicine

## 2013-04-21 ENCOUNTER — Telehealth: Payer: Self-pay | Admitting: *Deleted

## 2013-04-21 VITALS — BP 144/85 | HR 89 | Temp 97.6°F | Resp 20 | Ht 62.0 in | Wt 223.3 lb

## 2013-04-21 DIAGNOSIS — D6481 Anemia due to antineoplastic chemotherapy: Secondary | ICD-10-CM

## 2013-04-21 DIAGNOSIS — C3490 Malignant neoplasm of unspecified part of unspecified bronchus or lung: Secondary | ICD-10-CM

## 2013-04-21 DIAGNOSIS — C3410 Malignant neoplasm of upper lobe, unspecified bronchus or lung: Secondary | ICD-10-CM

## 2013-04-21 DIAGNOSIS — Z5111 Encounter for antineoplastic chemotherapy: Secondary | ICD-10-CM

## 2013-04-21 LAB — COMPREHENSIVE METABOLIC PANEL (CC13)
CO2: 23 mEq/L (ref 22–29)
Calcium: 9.3 mg/dL (ref 8.4–10.4)
Chloride: 105 mEq/L (ref 98–107)
Creatinine: 1.2 mg/dL — ABNORMAL HIGH (ref 0.6–1.1)
Glucose: 130 mg/dl — ABNORMAL HIGH (ref 70–99)
Total Bilirubin: 0.3 mg/dL (ref 0.20–1.20)
Total Protein: 9.1 g/dL — ABNORMAL HIGH (ref 6.4–8.3)

## 2013-04-21 LAB — CBC WITH DIFFERENTIAL/PLATELET
Eosinophils Absolute: 0 10*3/uL (ref 0.0–0.5)
HCT: 27.1 % — ABNORMAL LOW (ref 34.8–46.6)
HGB: 8.3 g/dL — ABNORMAL LOW (ref 11.6–15.9)
LYMPH%: 11.7 % — ABNORMAL LOW (ref 14.0–49.7)
MONO#: 0.7 10*3/uL (ref 0.1–0.9)
NEUT#: 5.1 10*3/uL (ref 1.5–6.5)
NEUT%: 77.2 % — ABNORMAL HIGH (ref 38.4–76.8)
Platelets: 325 10*3/uL (ref 145–400)
WBC: 6.6 10*3/uL (ref 3.9–10.3)

## 2013-04-21 MED ORDER — DEXAMETHASONE SODIUM PHOSPHATE 20 MG/5ML IJ SOLN
20.0000 mg | Freq: Once | INTRAMUSCULAR | Status: AC
  Administered 2013-04-21: 20 mg via INTRAVENOUS

## 2013-04-21 MED ORDER — CYANOCOBALAMIN 1000 MCG/ML IJ SOLN: 1000.0000 ug | Freq: Once | INTRAMUSCULAR | Status: DC

## 2013-04-21 MED ORDER — SODIUM CHLORIDE 0.9 % IV SOLN
438.0000 mg | Freq: Once | INTRAVENOUS | Status: AC
  Administered 2013-04-21: 440 mg via INTRAVENOUS
  Filled 2013-04-21: qty 44

## 2013-04-21 MED ORDER — PEMETREXED DISODIUM CHEMO INJECTION 500 MG
500.0000 mg/m2 | Freq: Once | INTRAVENOUS | Status: AC
  Administered 2013-04-21: 1050 mg via INTRAVENOUS
  Filled 2013-04-21: qty 42

## 2013-04-21 MED ORDER — SODIUM CHLORIDE 0.9 % IV SOLN
Freq: Once | INTRAVENOUS | Status: AC
  Administered 2013-04-21: 13:00:00 via INTRAVENOUS

## 2013-04-21 MED ORDER — CYANOCOBALAMIN 1000 MCG/ML IJ SOLN
1000.0000 ug | Freq: Once | INTRAMUSCULAR | Status: AC
  Administered 2013-04-21: 1000 ug via INTRAMUSCULAR

## 2013-04-21 MED ORDER — ONDANSETRON 16 MG/50ML IVPB (CHCC)
16.0000 mg | Freq: Once | INTRAVENOUS | Status: AC
  Administered 2013-04-21: 16 mg via INTRAVENOUS

## 2013-04-21 NOTE — Telephone Encounter (Signed)
Per staff message and POF I have scheduled appts.  JMW  

## 2013-04-21 NOTE — Patient Instructions (Signed)
Followup visit in 3 weeks with repeat CT scan of the chest, abdomen and pelvis. 

## 2013-04-21 NOTE — Progress Notes (Signed)
It is okay to treat pt today with chemo and hgb 8.3

## 2013-04-21 NOTE — Progress Notes (Signed)
[facility] [phone]   Fax:([phone]  OFFICE PROGRESS NOTE  Sanjuana Letters, MD [address] [place] [address]  DIAGNOSIS: Metastatic non-small cell lung cancer, adenocarcinoma with negative EGFR mutation and positive BRAF mutation, diagnosed in June of 2010.   PRIOR THERAPY: Status post treatment with zelboraf 960 mg by mouth daily for less than 2 weeks at [facility] in [facility] in June of 2013 discontinued secondary to intolerance.   CURRENT THERAPY: Systemic chemotherapy with carboplatin for AUC of 5 and Alimta 500 mg/M2 every 3 weeks. Status post 5 cycles.   INTERVAL HISTORY: Shannon Camacho 79 y.o. female returns to the clinic today for followup visit accompanied by her daughter. The patient tolerated the last cycle of her systemic chemotherapy with carboplatin and Alimta fairly well except for mild fatigue. She denied having any significant chest pain but continues to have shortness breath with exertion. She denied having any cough or hemoptysis. The patient denied having any weight loss or night sweats. She has no nausea or vomiting.  MEDICAL HISTORY: Past Medical History  Diagnosis Date  . Hypertension   . lung ca dx'd 04/2009    ALLERGIES:  is allergic to zelboraf.  MEDICATIONS:  Current Outpatient Prescriptions  Medication Sig Dispense Refill  . aspirin 81 MG tablet Take 81 mg by mouth daily.      . cholecalciferol (VITAMIN D) 400 UNITS TABS Take 400 Units by mouth daily.      . ciclopirox (LOPROX) 0.77 % cream Apply 1 application topically daily. Uses for feet.      Marland Kitchen dexamethasone (DECADRON) 4 MG tablet 4 mg by mouth twice a day the day before, day of and day after the chemotherapy every 3 weeks with chemotherapy  40 tablet  0  . diclofenac sodium (VOLTAREN) 1 % GEL Apply 4 g topically 4 (four) times daily. Uses on knees  100 g  12  . folic acid (FOLVITE) 1 MG tablet Take 1  tablet (1 mg total) by mouth daily.  30 tablet  4  . hydrochlorothiazide (MICROZIDE) 12.5 MG capsule Take 1 capsule (12.5 mg total) by mouth daily.  90 capsule  3  . levothyroxine (SYNTHROID, LEVOTHROID) 88 MCG tablet Take 1 tablet (88 mcg total) by mouth daily.  90 tablet  3  . metoprolol tartrate (LOPRESSOR) 25 MG tablet Take 1 tablet (25 mg total) by mouth 2 (two) times daily.  180 tablet  3  . potassium chloride SA (K-DUR,KLOR-CON) 20 MEQ tablet Take 1 tablet (20 mEq total) by mouth as directed.  7 tablet  0  . pravastatin (PRAVACHOL) 40 MG tablet Take 1.5 tablets (60 mg total) by mouth daily.  135 tablet  3  . prochlorperazine (COMPAZINE) 10 MG tablet Take 1 tablet (10 mg total) by mouth every 6 (six) hours as needed.  60 tablet  0  . traMADol (ULTRAM) 50 MG tablet Take 1 tablet (50 mg total) by mouth every 6 (six) hours as needed.  90 tablet  3  . traZODone (DESYREL) 50 MG tablet Take 0.5-1 tablets (25-50 mg total) by mouth at bedtime as needed for sleep.  30 tablet  3   No current facility-administered medications for this visit.    REVIEW OF SYSTEMS:  A comprehensive review of systems was negative except for: Constitutional: positive for fatigue Respiratory: positive for dyspnea on exertion   PHYSICAL EXAMINATION: General appearance: alert, cooperative, fatigued and no distress Head:  Normocephalic, without obvious abnormality, atraumatic Neck: no adenopathy Lymph nodes: Cervical, supraclavicular, and axillary nodes normal. Resp: clear to auscultation bilaterally Cardio: regular rate and rhythm, S1, S2 normal, no murmur, click, rub or gallop GI: soft, non-tender; bowel sounds normal; no masses,  no organomegaly Extremities: extremities normal, atraumatic, no cyanosis or edema  ECOG PERFORMANCE STATUS: 1 - Symptomatic but completely ambulatory  Blood pressure 144/85, pulse 89, temperature 97.6 F (36.4 C), temperature source Oral, resp. rate 20, height 5\' 2"  (1.575 m), weight 223 lb  4.8 oz (101.288 kg).  LABORATORY DATA: Lab Results  Component Value Date   WBC 6.6 04/21/2013   HGB 8.3* 04/21/2013   HCT 27.1* 04/21/2013   MCV 92.8 04/21/2013   PLT 325 04/21/2013      Chemistry      Component Value Date/Time   NA 140 04/13/2013 0853   NA 143 12/03/2012 1653   K 3.3* 04/13/2013 0853   K 3.8 12/03/2012 1653   CL 102 04/13/2013 0853   CL 109 12/03/2012 1653   CO2 26 04/13/2013 0853   CO2 27 12/03/2012 1653   BUN 13.8 04/13/2013 0853   BUN 21 12/03/2012 1653   CREATININE 1.1 04/13/2013 0853   CREATININE 0.91 12/03/2012 1653   CREATININE 1.10 09/12/2009 1316      Component Value Date/Time   CALCIUM 8.4 04/13/2013 0853   CALCIUM 9.1 12/03/2012 1653   ALKPHOS 86 04/13/2013 0853   ALKPHOS 86 12/03/2012 1653   AST 45* 04/13/2013 0853   AST 20 12/03/2012 1653   ALT 34 04/13/2013 0853   ALT 15 12/03/2012 1653   BILITOT 0.54 04/13/2013 0853   BILITOT 0.3 12/03/2012 1653       RADIOGRAPHIC STUDIES: No results found.  ASSESSMENT: This is a very pleasant 79 years old African American female African American female was metastatic non-small cell lung cancer currently on systemic chemotherapy with carboplatin and Alimta status post 5 cycles.   PLAN: The patient is tolerating her treatment fairly well with no significant adverse effects except for mild fatigue from chemotherapy-induced anemia. Will proceed with cycle #6 today as scheduled She would come back for followup visit in 3 weeks with repeat CT scan of the chest, abdomen and pelvis for restaging of her disease. I would consider the patient for PRBCs transfusion as her hemoglobin less than 8 g/dL. She was advised to call immediately if she has any concerning symptoms in the interval. All questions were answered. The patient knows to call the clinic with any problems, questions or concerns. We can certainly see the patient much sooner if necessary.

## 2013-04-21 NOTE — Telephone Encounter (Signed)
gv and printed appt sched and avs for pt...emailed MW to add tx....pt aware...gv pt barium

## 2013-04-21 NOTE — Patient Instructions (Signed)
[facility] Discharge Instructions for Patients Receiving Chemotherapy  Today you received the following chemotherapy agents Alimta/Carboplatin.  To help prevent nausea and vomiting after your treatment, we encourage you to take your nausea medication as prescribed.   If you develop nausea and vomiting that is not controlled by your nausea medication, call the clinic. If it is after clinic hours your family physician or the after hours number for the clinic or go to the Emergency Department.   BELOW ARE SYMPTOMS THAT SHOULD BE REPORTED IMMEDIATELY:  *FEVER GREATER THAN 100.5 F  *CHILLS WITH OR WITHOUT FEVER  NAUSEA AND VOMITING THAT IS NOT CONTROLLED WITH YOUR NAUSEA MEDICATION  *UNUSUAL SHORTNESS OF BREATH  *UNUSUAL BRUISING OR BLEEDING  TENDERNESS IN MOUTH AND THROAT WITH OR WITHOUT PRESENCE OF ULCERS  *URINARY PROBLEMS  *BOWEL PROBLEMS  UNUSUAL RASH Items with * indicate a potential emergency and should be followed up as soon as possible.  Feel free to call the clinic you have any questions or concerns. The clinic phone number is [phone].   I have been informed and understand all the instructions given to me. I know to contact the clinic, my physician, or go to the Emergency Department if any problems should occur. I do not have any questions at this time, but understand that I may call the clinic during office hours   should I have any questions or need assistance in obtaining follow up care.    __________________________________________  _____________  __________ Signature of Patient or Authorized Representative            Date                   Time    __________________________________________ Nurse's Signature    

## 2013-04-22 ENCOUNTER — Other Ambulatory Visit: Payer: Self-pay | Admitting: Family Medicine

## 2013-04-22 ENCOUNTER — Other Ambulatory Visit: Payer: Self-pay | Admitting: *Deleted

## 2013-04-22 DIAGNOSIS — I10 Essential (primary) hypertension: Secondary | ICD-10-CM

## 2013-04-22 MED ORDER — PRAVASTATIN SODIUM 40 MG PO TABS: 60.0000 mg | ORAL_TABLET | Freq: Every day | ORAL | Status: DC

## 2013-04-22 NOTE — Telephone Encounter (Signed)
Requested Prescriptions     Pending Prescriptions Disp Refills   • pravastatin (PRAVACHOL) 40 MG tablet 135 tablet 3     Sig: Take 1.5 tablets (60 mg total) by mouth daily

## 2013-04-27 ENCOUNTER — Other Ambulatory Visit (HOSPITAL_BASED_OUTPATIENT_CLINIC_OR_DEPARTMENT_OTHER): Payer: PRIVATE HEALTH INSURANCE | Admitting: Lab

## 2013-04-27 DIAGNOSIS — C3490 Malignant neoplasm of unspecified part of unspecified bronchus or lung: Secondary | ICD-10-CM

## 2013-04-27 DIAGNOSIS — D649 Anemia, unspecified: Secondary | ICD-10-CM

## 2013-04-27 DIAGNOSIS — C3410 Malignant neoplasm of upper lobe, unspecified bronchus or lung: Secondary | ICD-10-CM

## 2013-04-27 LAB — COMPREHENSIVE METABOLIC PANEL (CC13)
Alkaline Phosphatase: 80 U/L (ref 40–150)
Creatinine: 1.2 mg/dL — ABNORMAL HIGH (ref 0.6–1.1)
Glucose: 110 mg/dl — ABNORMAL HIGH (ref 70–99)
Sodium: 137 mEq/L (ref 136–145)
Total Bilirubin: 0.54 mg/dL (ref 0.20–1.20)
Total Protein: 8.1 g/dL (ref 6.4–8.3)

## 2013-04-27 LAB — CBC WITH DIFFERENTIAL/PLATELET
Eosinophils Absolute: 0 10*3/uL (ref 0.0–0.5)
LYMPH%: 28.6 % (ref 14.0–49.7)
MCHC: 32.2 g/dL (ref 31.5–36.0)
MCV: 90.4 fL (ref 79.5–101.0)
MONO%: 5 % (ref 0.0–14.0)
NEUT#: 1.7 10*3/uL (ref 1.5–6.5)
NEUT%: 64.7 % (ref 38.4–76.8)
Platelets: 258 10*3/uL (ref 145–400)
RBC: 3.05 10*6/uL — ABNORMAL LOW (ref 3.70–5.45)

## 2013-05-01 ENCOUNTER — Other Ambulatory Visit: Payer: Self-pay | Admitting: Medical Oncology

## 2013-05-01 DIAGNOSIS — C3490 Malignant neoplasm of unspecified part of unspecified bronchus or lung: Secondary | ICD-10-CM

## 2013-05-04 ENCOUNTER — Other Ambulatory Visit (HOSPITAL_BASED_OUTPATIENT_CLINIC_OR_DEPARTMENT_OTHER): Payer: PRIVATE HEALTH INSURANCE

## 2013-05-04 ENCOUNTER — Other Ambulatory Visit: Payer: Self-pay | Admitting: Medical Oncology

## 2013-05-04 DIAGNOSIS — E876 Hypokalemia: Secondary | ICD-10-CM

## 2013-05-04 DIAGNOSIS — C3490 Malignant neoplasm of unspecified part of unspecified bronchus or lung: Secondary | ICD-10-CM

## 2013-05-04 LAB — COMPREHENSIVE METABOLIC PANEL (CC13)
Alkaline Phosphatase: 76 U/L (ref 40–150)
BUN: 16.3 mg/dL (ref 7.0–26.0)
CO2: 28 mEq/L (ref 22–29)
Creatinine: 1.3 mg/dL — ABNORMAL HIGH (ref 0.6–1.1)
Glucose: 103 mg/dl — ABNORMAL HIGH (ref 70–99)
Total Bilirubin: 0.37 mg/dL (ref 0.20–1.20)

## 2013-05-04 LAB — CBC WITH DIFFERENTIAL/PLATELET
Basophils Absolute: 0 10*3/uL (ref 0.0–0.1)
Eosinophils Absolute: 0.1 10*3/uL (ref 0.0–0.5)
HCT: 23.7 % — ABNORMAL LOW (ref 34.8–46.6)
HGB: 7.8 g/dL — ABNORMAL LOW (ref 11.6–15.9)
LYMPH%: 25 % (ref 14.0–49.7)
MCV: 89.6 fL (ref 79.5–101.0)
MONO%: 17.8 % — ABNORMAL HIGH (ref 0.0–14.0)
NEUT#: 2.3 10*3/uL (ref 1.5–6.5)
NEUT%: 55.3 % (ref 38.4–76.8)
Platelets: 102 10*3/uL — ABNORMAL LOW (ref 145–400)
RBC: 2.64 10*6/uL — ABNORMAL LOW (ref 3.70–5.45)

## 2013-05-04 MED ORDER — POTASSIUM CHLORIDE CRYS ER 20 MEQ PO TBCR: 20.0000 meq | EXTENDED_RELEASE_TABLET | Freq: Two times a day (BID) | ORAL | Status: DC

## 2013-05-04 NOTE — Progress Notes (Signed)
Quick Note:  Call patient with the result and arrange for 2 units of PRBCs. ______ 

## 2013-05-05 ENCOUNTER — Encounter (HOSPITAL_COMMUNITY)
Admission: RE | Admit: 2013-05-05 | Discharge: 2013-05-05 | Disposition: A | Discharge status: Home / Self Care | Payer: PRIVATE HEALTH INSURANCE | Source: Ambulatory Visit | Attending: Internal Medicine | Admitting: Internal Medicine

## 2013-05-05 ENCOUNTER — Telehealth: Payer: Self-pay | Admitting: Medical Oncology

## 2013-05-05 ENCOUNTER — Other Ambulatory Visit: Payer: Self-pay | Admitting: Medical Oncology

## 2013-05-05 DIAGNOSIS — D649 Anemia, unspecified: Secondary | ICD-10-CM

## 2013-05-05 NOTE — Telephone Encounter (Signed)
ONC TX request sent for lab and blood transfusion.

## 2013-05-06 ENCOUNTER — Telehealth: Payer: Self-pay | Admitting: *Deleted

## 2013-05-06 ENCOUNTER — Ambulatory Visit (INDEPENDENT_AMBULATORY_CARE_PROVIDER_SITE_OTHER): Payer: PRIVATE HEALTH INSURANCE | Admitting: Family Medicine

## 2013-05-06 ENCOUNTER — Telehealth: Payer: Self-pay | Admitting: Internal Medicine

## 2013-05-06 ENCOUNTER — Other Ambulatory Visit: Payer: PRIVATE HEALTH INSURANCE

## 2013-05-06 VITALS — BP 118/60 | HR 67 | Temp 98.6°F | Wt 216.8 lb

## 2013-05-06 DIAGNOSIS — R300 Dysuria: Secondary | ICD-10-CM

## 2013-05-06 DIAGNOSIS — N3941 Urge incontinence: Secondary | ICD-10-CM

## 2013-05-06 LAB — POCT URINALYSIS DIPSTICK
Protein, UA: 30
Spec Grav, UA: 1.015
Urobilinogen, UA: 0.2
pH, UA: 6.5

## 2013-05-06 LAB — PREPARE RBC (CROSSMATCH)

## 2013-05-06 LAB — POCT UA - MICROSCOPIC ONLY: WBC, Ur, HPF, POC: >20

## 2013-05-06 NOTE — Addendum Note (Signed)
Addended by: [place], Nicoles Sedlacek on: 05/06/2013 04:26 PM   Modules accepted: Orders

## 2013-05-06 NOTE — Telephone Encounter (Signed)
s.w. pt daughter and advised on lab today....emailed MW to add tx on friday...will call pt with d.t

## 2013-05-06 NOTE — Assessment & Plan Note (Signed)
Check UA to make certain not an infection, however story consistent with urge incontinence/overactive bladder.  If no infection will begin treatment.  Pt will have to bring UA back to clinic as she was unable to produce specimen today.

## 2013-05-06 NOTE — Telephone Encounter (Signed)
Per staff message and POF I have scheduled appts.  JMW  

## 2013-05-06 NOTE — Telephone Encounter (Signed)
s.w. pt daughter and advised on 6.13.14 appt....ok and aware

## 2013-05-06 NOTE — Progress Notes (Signed)
Patient ID: Tanga Gloor, female   DOB: 08/17/1928, 79 y.o.   MRN: [id number] Subjective: The patient is a 79 y.o. year old female who presents today for urinary incontinence.  The patient reports approximately a one-year history of problems with incontinence. She does not report frequent urination but reports that when she feels the urge is oftentimes not able to make it to the restroom. This is gotten significantly worse since the initiation of her chemotherapy in February. She denies any leakage in between and is not have significantly increased frequency. She does not have any nocturia. She does not have any pain with urination.  G5P5  Patient's past medical, social, and family history were reviewed and updated as appropriate. History  Substance Use Topics  . Smoking status: Former Smoker    Quit date: 12/03/1973  . Smokeless tobacco: Never Used  . Alcohol Use: No   Objective:  Filed Vitals:   05/06/13 0838  BP: 118/60  Pulse: 67  Temp: 98.6 F (37 C)   Gen: NAD CV: RRR, no murmur Resp: CTABL Abd: SNTND  Assessment/Plan:  Please also see individual problems in problem list for problem-specific plans.

## 2013-05-08 ENCOUNTER — Ambulatory Visit (HOSPITAL_COMMUNITY)
Admission: RE | Admit: 2013-05-08 | Discharge: 2013-05-08 | Disposition: A | Discharge status: Home / Self Care | Payer: PRIVATE HEALTH INSURANCE | Source: Ambulatory Visit | Attending: Internal Medicine | Admitting: Internal Medicine

## 2013-05-08 ENCOUNTER — Ambulatory Visit (HOSPITAL_BASED_OUTPATIENT_CLINIC_OR_DEPARTMENT_OTHER): Payer: PRIVATE HEALTH INSURANCE

## 2013-05-08 VITALS — BP 167/84 | HR 82 | Temp 98.0°F | Resp 20

## 2013-05-08 DIAGNOSIS — D649 Anemia, unspecified: Secondary | ICD-10-CM

## 2013-05-08 DIAGNOSIS — Z79899 Other long term (current) drug therapy: Secondary | ICD-10-CM | POA: Insufficient documentation

## 2013-05-08 DIAGNOSIS — C3490 Malignant neoplasm of unspecified part of unspecified bronchus or lung: Secondary | ICD-10-CM

## 2013-05-08 DIAGNOSIS — E279 Disorder of adrenal gland, unspecified: Secondary | ICD-10-CM | POA: Insufficient documentation

## 2013-05-08 DIAGNOSIS — Z9089 Acquired absence of other organs: Secondary | ICD-10-CM | POA: Insufficient documentation

## 2013-05-08 DIAGNOSIS — C771 Secondary and unspecified malignant neoplasm of intrathoracic lymph nodes: Secondary | ICD-10-CM | POA: Insufficient documentation

## 2013-05-08 LAB — PREPARE RBC (CROSSMATCH)

## 2013-05-08 MED ORDER — ACETAMINOPHEN 325 MG PO TABS
650.0000 mg | ORAL_TABLET | Freq: Once | ORAL | Status: AC
  Administered 2013-05-08: 650 mg via ORAL

## 2013-05-08 MED ORDER — SODIUM CHLORIDE 0.9 % IV SOLN
250.0000 mL | Freq: Once | INTRAVENOUS | Status: AC
  Administered 2013-05-08: 250 mL via INTRAVENOUS

## 2013-05-08 MED ORDER — DIPHENHYDRAMINE HCL 50 MG/ML IJ SOLN
25.0000 mg | Freq: Once | INTRAMUSCULAR | Status: AC
  Administered 2013-05-08: 25 mg via INTRAVENOUS

## 2013-05-08 MED ORDER — IOHEXOL 300 MG/ML  SOLN
100.0000 mL | Freq: Once | INTRAMUSCULAR | Status: AC | PRN
  Administered 2013-05-08: 100 mL via INTRAVENOUS

## 2013-05-08 NOTE — Patient Instructions (Addendum)
Blood Transfusion  A blood transfusion replaces your blood or some of its parts. Blood is replaced when you have lost blood because of surgery, an accident, or for severe blood conditions like anemia. You can donate blood to be used on yourself if you have a planned surgery. If you lose blood during that surgery, your own blood can be given back to you. Any blood given to you is checked to make sure it matches your blood type. Your temperature, blood pressure, and heart rate (vital signs) will be checked often.  GET HELP RIGHT AWAY IF:   You feel sick to your stomach (nauseous) or throw up (vomit).  You have watery poop (diarrhea).  You have shortness of breath or trouble breathing.  You have blood in your pee (urine) or have dark colored pee.  You have chest pain or tightness.  Your eyes or skin turn yellow (jaundice).  You have a temperature by mouth above 102 F (38.9 C), not controlled by medicine.  You start to shake and have chills.  You develop a a red rash (hives) or feel itchy.  You develop lightheadedness or feel confused.  You develop back, joint, or muscle pain.  You do not feel hungry (lost appetite).  You feel tired, restless, or nervous.  You develop belly (abdominal) cramps. Document Released: 02/08/2009 Document Revised: 02/04/2012 Document Reviewed: 02/08/2009 [employer] Patient Information 2014 [employer].  

## 2013-05-08 NOTE — Progress Notes (Signed)
Pt's b/p at the end of 2nd unit prbc's was 167/84. Pt states she did not take her b/p meds this morning. She denies headache,dizziness, or any other signs/symptoms. Pt instructed to recheck b/p and take her b/p meds when she arrives home.  She was instructed to call her PCP if b/p continues to increase or she has any concerns or questions regarding her b/p.  Pt verbalized understanding of all. Pt discharged alert and ambulatory.

## 2013-05-09 LAB — TYPE AND SCREEN
ABO/RH(D): O POS
Antibody Screen: NEGATIVE
Unit division: 0
Unit division: 0

## 2013-05-09 LAB — URINE CULTURE

## 2013-05-11 ENCOUNTER — Ambulatory Visit (HOSPITAL_BASED_OUTPATIENT_CLINIC_OR_DEPARTMENT_OTHER): Payer: PRIVATE HEALTH INSURANCE | Admitting: Internal Medicine

## 2013-05-11 ENCOUNTER — Ambulatory Visit: Payer: PRIVATE HEALTH INSURANCE | Admitting: Lab

## 2013-05-11 ENCOUNTER — Other Ambulatory Visit (HOSPITAL_BASED_OUTPATIENT_CLINIC_OR_DEPARTMENT_OTHER): Payer: PRIVATE HEALTH INSURANCE | Admitting: Lab

## 2013-05-11 ENCOUNTER — Telehealth: Payer: Self-pay | Admitting: Internal Medicine

## 2013-05-11 ENCOUNTER — Ambulatory Visit: Payer: PRIVATE HEALTH INSURANCE

## 2013-05-11 ENCOUNTER — Encounter: Payer: Self-pay | Admitting: Internal Medicine

## 2013-05-11 VITALS — BP 153/78 | HR 93 | Temp 98.1°F | Resp 18 | Ht 62.0 in | Wt 219.0 lb

## 2013-05-11 DIAGNOSIS — C3490 Malignant neoplasm of unspecified part of unspecified bronchus or lung: Secondary | ICD-10-CM

## 2013-05-11 LAB — CBC WITH DIFFERENTIAL/PLATELET
BASO%: 0.8 % (ref 0.0–2.0)
Basophils Absolute: 0 10*3/uL (ref 0.0–0.1)
Eosinophils Absolute: 0.1 10*3/uL (ref 0.0–0.5)
HCT: 36.2 % (ref 34.8–46.6)
HGB: 11.5 g/dL — ABNORMAL LOW (ref 11.6–15.9)
LYMPH%: 16.5 % (ref 14.0–49.7)
MONO#: 1.6 10*3/uL — ABNORMAL HIGH (ref 0.1–0.9)
NEUT#: 2.5 10*3/uL (ref 1.5–6.5)
NEUT%: 49.4 % (ref 38.4–76.8)
Platelets: 294 10*3/uL (ref 145–400)
WBC: 5 10*3/uL (ref 3.9–10.3)
lymph#: 0.8 10*3/uL — ABNORMAL LOW (ref 0.9–3.3)

## 2013-05-11 LAB — COMPREHENSIVE METABOLIC PANEL (CC13)
BUN: 12.1 mg/dL (ref 7.0–26.0)
CO2: 27 mEq/L (ref 22–29)
Calcium: 9.2 mg/dL (ref 8.4–10.4)
Chloride: 104 mEq/L (ref 98–107)
Creatinine: 1.2 mg/dL — ABNORMAL HIGH (ref 0.6–1.1)
Total Bilirubin: 0.71 mg/dL (ref 0.20–1.20)

## 2013-05-11 NOTE — Telephone Encounter (Signed)
gv and printed appt shed and avs forpt...sent pt back to lab....gv pt barium

## 2013-05-11 NOTE — Progress Notes (Signed)
[facility] [phone]   [phone]  OFFICE PROGRESS NOTE  Sanjuana Letters, MD [address] [place] [address]  DIAGNOSIS: Metastatic non-small cell lung cancer, adenocarcinoma with negative EGFR mutation and positive BRAF mutation, diagnosed in June of 2010.   PRIOR THERAPY:  1) Status post treatment with zelboraf 960 mg by mouth daily for less than 2 weeks at [facility] in [facility] in June of 2013 discontinued secondary to intolerance.  2) Systemic chemotherapy with carboplatin for AUC of 5 and Alimta 500 mg/M2 every 3 weeks. Status post 6 cycles.   CURRENT THERAPY: Observation.  INTERVAL HISTORY: Shannon Camacho 79 y.o. female returns to the clinic today for followup visit accompanied by her daughter. The patient is feeling fine today except for mild fatigue. She received 2 units of PRBCs transfusion secondary to chemotherapy-induced anemia and this was complicated by the itching and mild skin rash. She denied having any significant chest pain, shortness breath, cough or hemoptysis. She has no significant nausea or vomiting. No fever or chills. The patient tolerated the last cycle of her systemic chemotherapy fairly well with no significant adverse effects. She had repeat CT scan of the chest, abdomen and pelvis for restaging of her disease and she is here today for discussion of her scan results and recommendation regarding her condition.  MEDICAL HISTORY: Past Medical History  Diagnosis Date  . Hypertension   . lung ca dx'd 04/2009    ALLERGIES:  is allergic to zelboraf.  MEDICATIONS:  Current Outpatient Prescriptions  Medication Sig Dispense Refill  . aspirin 81 MG tablet Take 81 mg by mouth daily.      . cholecalciferol (VITAMIN D) 400 UNITS TABS Take 400 Units by mouth daily.      . ciclopirox (LOPROX) 0.77 % cream Apply 1 application topically daily. Uses for feet.      . diclofenac  sodium (VOLTAREN) 1 % GEL Apply 4 g topically 4 (four) times daily. Uses on knees  100 g  12  . folic acid (FOLVITE) 1 MG tablet Take 1 tablet (1 mg total) by mouth daily.  30 tablet  4  . hydrochlorothiazide (MICROZIDE) 12.5 MG capsule Take 1 capsule (12.5 mg total) by mouth daily.  90 capsule  3  . levothyroxine (SYNTHROID, LEVOTHROID) 88 MCG tablet Take 1 tablet (88 mcg total) by mouth daily.  90 tablet  3  . metoprolol tartrate (LOPRESSOR) 25 MG tablet Take 1 tablet (25 mg total) by mouth 2 (two) times daily.  180 tablet  3  . pravastatin (PRAVACHOL) 40 MG tablet Take 1.5 tablets (60 mg total) by mouth daily.  135 tablet  3  . prochlorperazine (COMPAZINE) 10 MG tablet Take 1 tablet (10 mg total) by mouth every 6 (six) hours as needed.  60 tablet  0  . traMADol (ULTRAM) 50 MG tablet Take 1 tablet (50 mg total) by mouth every 6 (six) hours as needed.  90 tablet  3  . traZODone (DESYREL) 50 MG tablet Take 0.5-1 tablets (25-50 mg total) by mouth at bedtime as needed for sleep.  30 tablet  3  . dexamethasone (DECADRON) 4 MG tablet 4 mg by mouth twice a day the day before, day of and day after the chemotherapy every 3 weeks with chemotherapy  40 tablet  0   No current facility-administered medications for this visit.    REVIEW OF SYSTEMS:  A comprehensive review of systems was  negative except for: Constitutional: positive for fatigue   PHYSICAL EXAMINATION: General appearance: alert, cooperative, fatigued and no distress Head: Normocephalic, without obvious abnormality, atraumatic Neck: no adenopathy Lymph nodes: Cervical, supraclavicular, and axillary nodes normal. Resp: clear to auscultation bilaterally Cardio: regular rate and rhythm, S1, S2 normal, no murmur, click, rub or gallop GI: soft, non-tender; bowel sounds normal; no masses,  no organomegaly Extremities: extremities normal, atraumatic, no cyanosis or edema Neurologic: Alert and oriented X 3, normal strength and tone. Normal  symmetric reflexes. Normal coordination and gait  ECOG PERFORMANCE STATUS: 2 - Symptomatic, <50% confined to bed  Blood pressure 153/78, pulse 93, temperature 98.1 F (36.7 C), temperature source Oral, resp. rate 18, height 5\' 2"  (1.575 m), weight 219 lb (99.338 kg).  LABORATORY DATA: Lab Results  Component Value Date   WBC 5.0 05/11/2013   HGB 11.5* 05/11/2013   HCT 36.2 05/11/2013   MCV 91.9 05/11/2013   PLT 294 05/11/2013      Chemistry      Component Value Date/Time   NA 140 05/04/2013 1144   NA 143 12/03/2012 1653   K 2.9 Repeated and Verified* 05/04/2013 1144   K 3.8 12/03/2012 1653   CL 102 05/04/2013 1144   CL 109 12/03/2012 1653   CO2 28 05/04/2013 1144   CO2 27 12/03/2012 1653   BUN 16.3 05/04/2013 1144   BUN 21 12/03/2012 1653   CREATININE 1.3* 05/04/2013 1144   CREATININE 0.91 12/03/2012 1653   CREATININE 1.10 09/12/2009 1316      Component Value Date/Time   CALCIUM 9.1 05/04/2013 1144   CALCIUM 9.1 12/03/2012 1653   ALKPHOS 76 05/04/2013 1144   ALKPHOS 86 12/03/2012 1653   AST 28 05/04/2013 1144   AST 20 12/03/2012 1653   ALT 33 05/04/2013 1144   ALT 15 12/03/2012 1653   BILITOT 0.37 05/04/2013 1144   BILITOT 0.3 12/03/2012 1653       RADIOGRAPHIC STUDIES: Ct Chest W Contrast  05/08/2013   *RADIOLOGY REPORT*  Clinical Data:  Lung cancer diagnosed 2010.  Chemotherapy in progress.  Recent therapy complete.  CT CHEST, ABDOMEN AND PELVIS WITH CONTRAST  Technique:  Multidetector CT imaging of the chest, abdomen and pelvis was performed following the standard protocol during bolus administration of intravenous contrast.  Contrast: OMNIPAQUE IOHEXOL 300 MG/ML  SOLN  Comparison:  CT 03/06/2013  CT CHEST  Findings:  No axillary or supraclavicular lymphadenopathy.  14 mm right lower paratracheal lymph node is similar to 14 mm on prior. 11 mm left paratracheal lymph node is unchanged.  The large right perihilar mass measures slightly larger at 7.5 x 4.7 cm compared to 8.0 x 5.0 cm on comparison from  03/06/2013. Ground-glass nodule in the right lower lobe measures 27 mm x 26 mm decreased slightly from 37 x 34 mm on prior.  Other small ground- glass nodules and solid nodules are not significantly changed. Pleural-based nodule with peripheral ground-glass opacity in the left lower lobe measures 19 mm compared to 22 mm on prior.  There are no new pulmonary nodules.  IMPRESSION:  1.  Overall stable bilateral pulmonary and mediastinal nodal metastases with no evidence disease progression. 2.  Large right perihilar mass is slightly decreased in volume compared to prior. 3.  Several of the larger nodules that measures slightly smaller than prior. 4.  Smaller bilateral pulmonary metastasis are unchanged.  CT ABDOMEN AND PELVIS  Findings:  No focal hepatic lesion.  Post cholecystectomy.  The pancreas, spleen,  and right adrenal gland are normal.  There is a nodule measuring  2.2 x 2.5  cm involving the left adrenal gland which are not changed from 2.5 x 2.1 cm on prior.  The stomach, small bowel, appendix, and cecum are normal.  The colon and rectosigmoid colon are normal.  Abdominal aorta is normal caliber.  No retroperitoneal periportal lymphadenopathy.  No mesenteric or peritoneal metastasis.  No free fluid the pelvis.  The bladder and uterus are normal.  No pelvic lymphadenopathy. Review of  bone windows demonstrates no aggressive osseous lesions.  IMPRESSION:  1.  Stable nodule of the left adrenal gland. 2.  No evidence of metastatic disease progression in the abdomen or pelvis.   Original Report Authenticated By: Genevive Bi, M.D.   Ct Abdomen Pelvis W Contrast  05/08/2013   *RADIOLOGY REPORT*  Clinical Data:  Lung cancer diagnosed 2010.  Chemotherapy in progress.  Recent therapy complete.  CT CHEST, ABDOMEN AND PELVIS WITH CONTRAST  Technique:  Multidetector CT imaging of the chest, abdomen and pelvis was performed following the standard protocol during bolus administration of intravenous contrast.   Contrast: OMNIPAQUE IOHEXOL 300 MG/ML  SOLN  Comparison:  CT 03/06/2013  CT CHEST  Findings:  No axillary or supraclavicular lymphadenopathy.  14 mm right lower paratracheal lymph node is similar to 14 mm on prior. 11 mm left paratracheal lymph node is unchanged.  The large right perihilar mass measures slightly larger at 7.5 x 4.7 cm compared to 8.0 x 5.0 cm on comparison from 03/06/2013. Ground-glass nodule in the right lower lobe measures 27 mm x 26 mm decreased slightly from 37 x 34 mm on prior.  Other small ground- glass nodules and solid nodules are not significantly changed. Pleural-based nodule with peripheral ground-glass opacity in the left lower lobe measures 19 mm compared to 22 mm on prior.  There are no new pulmonary nodules.  IMPRESSION:  1.  Overall stable bilateral pulmonary and mediastinal nodal metastases with no evidence disease progression. 2.  Large right perihilar mass is slightly decreased in volume compared to prior. 3.  Several of the larger nodules that measures slightly smaller than prior. 4.  Smaller bilateral pulmonary metastasis are unchanged.  CT ABDOMEN AND PELVIS  Findings:  No focal hepatic lesion.  Post cholecystectomy.  The pancreas, spleen, and right adrenal gland are normal.  There is a nodule measuring  2.2 x 2.5  cm involving the left adrenal gland which are not changed from 2.5 x 2.1 cm on prior.  The stomach, small bowel, appendix, and cecum are normal.  The colon and rectosigmoid colon are normal.  Abdominal aorta is normal caliber.  No retroperitoneal periportal lymphadenopathy.  No mesenteric or peritoneal metastasis.  No free fluid the pelvis.  The bladder and uterus are normal.  No pelvic lymphadenopathy. Review of  bone windows demonstrates no aggressive osseous lesions.  IMPRESSION:  1.  Stable nodule of the left adrenal gland. 2.  No evidence of metastatic disease progression in the abdomen or pelvis.   Original Report Authenticated By: Genevive Bi, M.D.      ASSESSMENT AND PLAN: This is a very pleasant 79 years old African American female with metastatic non-small cell lung cancer, adenocarcinoma status post 6 cycles of systemic chemotherapy with carboplatin and Alimta was no significant evidence for disease progression on his recent scan. I have a lengthy discussion with the patient and her daughter today about her current disease status and treatment options. I gave the patient  the option of treatment with maintenance chemotherapy with single agent Alimta at 500 mg/M2 versus observation. The patient is not interested in proceeding with maintenance chemotherapy at this point. I would see her back for followup visit in 3 months with repeat CT scan of the chest, abdomen and pelvis for restaging of her disease. She was advised to call immediately if she has any concerning symptoms in the interval.  All questions were answered. The patient knows to call the clinic with any problems, questions or concerns. We can certainly see the patient much sooner if necessary.  I spent 15 minutes counseling the patient face to face. The total time spent in the appointment was 25 minutes.

## 2013-05-11 NOTE — Patient Instructions (Addendum)
No evidence for disease progression on his recent scan.  We discussed maintenance chemotherapy but he decided to go with observation.  I would see her back in followup visit in 3 months with repeat CT scan of the chest, abdomen and pelvis.

## 2013-05-12 ENCOUNTER — Telehealth: Payer: Self-pay | Admitting: *Deleted

## 2013-05-12 ENCOUNTER — Telehealth: Payer: Self-pay | Admitting: Family Medicine

## 2013-05-12 NOTE — Telephone Encounter (Signed)
Pt daughter here today for mother's ua results - please contact them as they are concerned or I can relay any information that is needed. Reprinted My Chart letter and explained to daughter how to sign up for future access to labs,hx, etc. Pt daughter verbalized understanding. Wyatt Haste, RN-BSN

## 2013-05-12 NOTE — Telephone Encounter (Signed)
Rolm Gala, I received some confusing message about this patient's urinalysis---I see you saw her on 6/17 and her culture showed proteus---did we do anything about this--please let me know THANKS! Denny Levy

## 2013-05-13 ENCOUNTER — Emergency Department (HOSPITAL_COMMUNITY): Payer: PRIVATE HEALTH INSURANCE

## 2013-05-13 ENCOUNTER — Encounter (HOSPITAL_COMMUNITY): Payer: Self-pay

## 2013-05-13 ENCOUNTER — Inpatient Hospital Stay (HOSPITAL_COMMUNITY)
Admission: EM | Admit: 2013-05-13 | Discharge: 2013-05-16 | DRG: 690 | Disposition: A | Discharge status: Home / Self Care | Payer: PRIVATE HEALTH INSURANCE | Attending: Family Medicine | Admitting: Family Medicine

## 2013-05-13 ENCOUNTER — Telehealth: Payer: Self-pay | Admitting: Family Medicine

## 2013-05-13 ENCOUNTER — Other Ambulatory Visit: Payer: Self-pay

## 2013-05-13 DIAGNOSIS — K222 Esophageal obstruction: Secondary | ICD-10-CM | POA: Diagnosis present

## 2013-05-13 DIAGNOSIS — K449 Diaphragmatic hernia without obstruction or gangrene: Secondary | ICD-10-CM | POA: Diagnosis present

## 2013-05-13 DIAGNOSIS — C3490 Malignant neoplasm of unspecified part of unspecified bronchus or lung: Secondary | ICD-10-CM

## 2013-05-13 DIAGNOSIS — E039 Hypothyroidism, unspecified: Secondary | ICD-10-CM | POA: Diagnosis present

## 2013-05-13 DIAGNOSIS — I129 Hypertensive chronic kidney disease with stage 1 through stage 4 chronic kidney disease, or unspecified chronic kidney disease: Secondary | ICD-10-CM | POA: Diagnosis present

## 2013-05-13 DIAGNOSIS — E785 Hyperlipidemia, unspecified: Secondary | ICD-10-CM | POA: Diagnosis present

## 2013-05-13 DIAGNOSIS — Z87891 Personal history of nicotine dependence: Secondary | ICD-10-CM

## 2013-05-13 DIAGNOSIS — D649 Anemia, unspecified: Secondary | ICD-10-CM | POA: Diagnosis present

## 2013-05-13 DIAGNOSIS — N3941 Urge incontinence: Secondary | ICD-10-CM

## 2013-05-13 DIAGNOSIS — I252 Old myocardial infarction: Secondary | ICD-10-CM

## 2013-05-13 DIAGNOSIS — Z85118 Personal history of other malignant neoplasm of bronchus and lung: Secondary | ICD-10-CM

## 2013-05-13 DIAGNOSIS — R739 Hyperglycemia, unspecified: Secondary | ICD-10-CM

## 2013-05-13 DIAGNOSIS — E669 Obesity, unspecified: Secondary | ICD-10-CM

## 2013-05-13 DIAGNOSIS — E876 Hypokalemia: Secondary | ICD-10-CM | POA: Diagnosis not present

## 2013-05-13 DIAGNOSIS — I10 Essential (primary) hypertension: Secondary | ICD-10-CM | POA: Diagnosis present

## 2013-05-13 DIAGNOSIS — B964 Proteus (mirabilis) (morganii) as the cause of diseases classified elsewhere: Secondary | ICD-10-CM | POA: Diagnosis present

## 2013-05-13 DIAGNOSIS — N390 Urinary tract infection, site not specified: Principal | ICD-10-CM | POA: Diagnosis present

## 2013-05-13 DIAGNOSIS — Z7982 Long term (current) use of aspirin: Secondary | ICD-10-CM

## 2013-05-13 DIAGNOSIS — R079 Chest pain, unspecified: Secondary | ICD-10-CM

## 2013-05-13 DIAGNOSIS — R1310 Dysphagia, unspecified: Secondary | ICD-10-CM

## 2013-05-13 DIAGNOSIS — Z9861 Coronary angioplasty status: Secondary | ICD-10-CM

## 2013-05-13 DIAGNOSIS — R0789 Other chest pain: Secondary | ICD-10-CM | POA: Diagnosis present

## 2013-05-13 DIAGNOSIS — K5900 Constipation, unspecified: Secondary | ICD-10-CM | POA: Diagnosis present

## 2013-05-13 DIAGNOSIS — Z79899 Other long term (current) drug therapy: Secondary | ICD-10-CM

## 2013-05-13 DIAGNOSIS — K259 Gastric ulcer, unspecified as acute or chronic, without hemorrhage or perforation: Secondary | ICD-10-CM | POA: Diagnosis present

## 2013-05-13 DIAGNOSIS — M1990 Unspecified osteoarthritis, unspecified site: Secondary | ICD-10-CM | POA: Diagnosis present

## 2013-05-13 DIAGNOSIS — N182 Chronic kidney disease, stage 2 (mild): Secondary | ICD-10-CM | POA: Diagnosis present

## 2013-05-13 DIAGNOSIS — I2510 Atherosclerotic heart disease of native coronary artery without angina pectoris: Secondary | ICD-10-CM

## 2013-05-13 DIAGNOSIS — K219 Gastro-esophageal reflux disease without esophagitis: Secondary | ICD-10-CM | POA: Diagnosis present

## 2013-05-13 HISTORY — DX: Obesity, unspecified: E66.9

## 2013-05-13 HISTORY — DX: Gastro-esophageal reflux disease without esophagitis: K21.9

## 2013-05-13 HISTORY — DX: Malignant neoplasm of unspecified part of unspecified bronchus or lung: C34.90

## 2013-05-13 HISTORY — DX: Presbycusis, unspecified ear: H91.10

## 2013-05-13 HISTORY — DX: Unspecified osteoarthritis, unspecified site: M19.90

## 2013-05-13 HISTORY — DX: Insomnia, unspecified: G47.00

## 2013-05-13 HISTORY — DX: Shortness of breath: R06.02

## 2013-05-13 HISTORY — DX: Hyperglycemia, unspecified: R73.9

## 2013-05-13 HISTORY — DX: Acute myocardial infarction, unspecified: I21.9

## 2013-05-13 HISTORY — DX: Hypothyroidism, unspecified: E03.9

## 2013-05-13 HISTORY — DX: Anemia, unspecified: D64.9

## 2013-05-13 HISTORY — DX: Atherosclerotic heart disease of native coronary artery without angina pectoris: I25.10

## 2013-05-13 HISTORY — DX: Essential (primary) hypertension: I10

## 2013-05-13 HISTORY — DX: Hyperlipidemia, unspecified: E78.5

## 2013-05-13 LAB — TROPONIN I: Troponin I: <0.3 ng/mL (ref <0.30)

## 2013-05-13 LAB — URINALYSIS, ROUTINE W REFLEX MICROSCOPIC
Ketones, ur: 15 mg/dL — AB
Nitrite: POSITIVE — AB
Protein, ur: >300 mg/dL — AB
Urobilinogen, UA: 1 mg/dL (ref 0.0–1.0)

## 2013-05-13 LAB — CBC
HCT: 33 % — ABNORMAL LOW (ref 36.0–46.0)
Hemoglobin: 10.5 g/dL — ABNORMAL LOW (ref 12.0–15.0)
MCH: 28.9 pg (ref 26.0–34.0)
MCV: 90.9 fL (ref 78.0–100.0)
RBC: 3.63 MIL/uL — ABNORMAL LOW (ref 3.87–5.11)

## 2013-05-13 LAB — COMPREHENSIVE METABOLIC PANEL
ALT: 22 U/L (ref 0–35)
BUN: 15 mg/dL (ref 6–23)
CO2: 26 mEq/L (ref 19–32)
Calcium: 9.1 mg/dL (ref 8.4–10.5)
GFR calc Af Amer: 42 mL/min — ABNORMAL LOW (ref >90)
GFR calc non Af Amer: 37 mL/min — ABNORMAL LOW (ref >90)
Glucose, Bld: 111 mg/dL — ABNORMAL HIGH (ref 70–99)
Sodium: 139 mEq/L (ref 135–145)

## 2013-05-13 LAB — HEMOGLOBIN A1C: Mean Plasma Glucose: 117 mg/dL — ABNORMAL HIGH (ref <117)

## 2013-05-13 LAB — URINE MICROSCOPIC-ADD ON

## 2013-05-13 LAB — POCT I-STAT TROPONIN I

## 2013-05-13 LAB — PROTIME-INR: Prothrombin Time: 13.2 seconds (ref 11.6–15.2)

## 2013-05-13 LAB — APTT: aPTT: 31 seconds (ref 24–37)

## 2013-05-13 MED ORDER — METOPROLOL TARTRATE 25 MG PO TABS
25.0000 mg | ORAL_TABLET | Freq: Two times a day (BID) | ORAL | Status: DC
  Filled 2013-05-13 (×3): qty 1

## 2013-05-13 MED ORDER — SODIUM CHLORIDE 0.9 % IV SOLN
80.0000 mg | INTRAVENOUS | Status: DC
  Administered 2013-05-13: 80 mg via INTRAVENOUS
  Filled 2013-05-13 (×2): qty 80

## 2013-05-13 MED ORDER — SIMVASTATIN 20 MG PO TABS
30.0000 mg | ORAL_TABLET | Freq: Every day | ORAL | Status: DC
  Administered 2013-05-15: 30 mg via ORAL
  Filled 2013-05-13 (×4): qty 1

## 2013-05-13 MED ORDER — LEVOTHYROXINE SODIUM 88 MCG PO TABS
88.0000 ug | ORAL_TABLET | Freq: Every day | ORAL | Status: DC
  Administered 2013-05-14 – 2013-05-16 (×3): 88 ug via ORAL
  Filled 2013-05-13 (×6): qty 1

## 2013-05-13 MED ORDER — ONDANSETRON HCL 4 MG/2ML IJ SOLN: 4.0000 mg | Freq: Four times a day (QID) | INTRAMUSCULAR | Status: DC | PRN

## 2013-05-13 MED ORDER — NITROGLYCERIN 0.4 MG SL SUBL: 0.4000 mg | SUBLINGUAL_TABLET | SUBLINGUAL | Status: DC | PRN

## 2013-05-13 MED ORDER — HEPARIN SODIUM (PORCINE) 5000 UNIT/ML IJ SOLN
5000.0000 [IU] | Freq: Three times a day (TID) | INTRAMUSCULAR | Status: DC
  Administered 2013-05-13 – 2013-05-16 (×9): 5000 [IU] via SUBCUTANEOUS
  Filled 2013-05-13 (×12): qty 1

## 2013-05-13 MED ORDER — ACETAMINOPHEN 325 MG PO TABS: 650.0000 mg | ORAL_TABLET | ORAL | Status: DC | PRN

## 2013-05-13 MED ORDER — DEXTROSE 5 % IV SOLN
1.0000 g | INTRAVENOUS | Status: AC
  Administered 2013-05-13 – 2013-05-15 (×3): 1 g via INTRAVENOUS
  Filled 2013-05-13 (×3): qty 10

## 2013-05-13 MED ORDER — GLUCAGON HCL (RDNA) 1 MG IJ SOLR
0.2500 mg | Freq: Once | INTRAMUSCULAR | Status: AC
  Administered 2013-05-13: 0.25 mg via INTRAVENOUS
  Filled 2013-05-13 (×2): qty 1

## 2013-05-13 MED ORDER — HYDROCHLOROTHIAZIDE 12.5 MG PO CAPS
12.5000 mg | ORAL_CAPSULE | Freq: Every day | ORAL | Status: DC
  Administered 2013-05-14 – 2013-05-16 (×3): 12.5 mg via ORAL
  Filled 2013-05-13 (×4): qty 1

## 2013-05-13 MED ORDER — ASPIRIN 81 MG PO TABS: 81.0000 mg | ORAL_TABLET | Freq: Every day | ORAL | Status: DC

## 2013-05-13 MED ORDER — SODIUM CHLORIDE 0.9 % IV SOLN
1000.0000 mL | INTRAVENOUS | Status: DC
  Administered 2013-05-13: 1000 mL via INTRAVENOUS

## 2013-05-13 MED ORDER — ASPIRIN 81 MG PO CHEW: 324.0000 mg | CHEWABLE_TABLET | Freq: Once | ORAL | Status: DC

## 2013-05-13 MED ORDER — CEPHALEXIN 500 MG PO CAPS: 500.0000 mg | ORAL_CAPSULE | Freq: Three times a day (TID) | ORAL | Status: DC

## 2013-05-13 MED ORDER — ASPIRIN 81 MG PO CHEW
81.0000 mg | CHEWABLE_TABLET | Freq: Every day | ORAL | Status: DC
  Administered 2013-05-14 – 2013-05-16 (×3): 81 mg via ORAL
  Filled 2013-05-13 (×3): qty 1

## 2013-05-13 MED ORDER — FAMOTIDINE IN NACL 20-0.9 MG/50ML-% IV SOLN
20.0000 mg | Freq: Once | INTRAVENOUS | Status: AC
  Administered 2013-05-13: 20 mg via INTRAVENOUS
  Filled 2013-05-13: qty 50

## 2013-05-13 MED ORDER — TRAZODONE 25 MG HALF TABLET
25.0000 mg | ORAL_TABLET | Freq: Every evening | ORAL | Status: DC | PRN
  Filled 2013-05-13: qty 2

## 2013-05-13 NOTE — ED Notes (Signed)
Dr Jennette Kettle responded and states to keep pt NPO and take pt to floor and Dr Jennette Kettle and/or family practice will re-evaluate pt when taken to floor. Pt and family have been notified that pt is no longer to eat or drink anything due to difficulty and per orders by Dr Jennette Kettle.

## 2013-05-13 NOTE — ED Notes (Signed)
Family practice at bedside.

## 2013-05-13 NOTE — Telephone Encounter (Signed)
Please let pt know urine did grow bacteria so we will treat her with 10 days of antibiotics.  She should plan on coming back to have a urine repeated in about 1 month.

## 2013-05-13 NOTE — ED Notes (Signed)
Re-paged [facility] x3 to [phone]

## 2013-05-13 NOTE — ED Notes (Signed)
Phlebotomy at bedside.

## 2013-05-13 NOTE — ED Notes (Signed)
Family at bedside. 

## 2013-05-13 NOTE — ED Notes (Signed)
Report attempted 

## 2013-05-13 NOTE — ED Notes (Signed)
Report called to floor and given to Irving Burton, Charity fundraiser. Nurse has no further questions upon report given. Pt being prepared for transport to floor via Devota Pace, EMT.

## 2013-05-13 NOTE — ED Notes (Signed)
Pt. To stay NPO until [facility] reevaluates when pt. Arrives to the floor.

## 2013-05-13 NOTE — ED Notes (Signed)
Report to lauren, rn.  

## 2013-05-13 NOTE — H&P (Signed)
Family Medicine Teaching Service Admission H&P Service Pager: [phone]  Shannon Camacho 79 y.o. female  MRN: [id number]  DOB: [birth year] -  LOS: 0 days   Primary Care Provider:   Sanjuana Letters, MD  Consultants:  None   Primary Oncologist = Dr. Arbutus Ped   CODE STATUS: -    BRIEF PATIENT OVERVIEW  79 y.o. year old female presenting with 8 hour onset of chest discomfort consistent with esophageal spasm.  Recent evaluation for dysuria and frequency found to have a Proteus UTI that has been untreated.  Vital signs and labs reassuring at admission  6/18 - admit for ACS eval and UTI, Rocephin, PPI/H2 & Glucagon  CC  Chest Pain  HPI  Shannon Camacho is a 79 y.o. year old female who presented to the emergency department following a 30 minute episode of chest discomfort.  She reports she did not sleep last night which has been normal for her since beginning chemotherapy however early this morning around 5 AM began having acute onset of chest discomfort.  She does not describe this as a chest pressure but as an intermittent mid substernal chest tightening that is not associated with diaphoresis or dyspnea but is associated with difficulty in swallowing.  She has no difficulty with her breathing, this is not exertionally related however the patient does have a decreased mobility status at baseline.  She reports this pain does not radiate.  This pain is significantly different than her anginal equivalent  While in the emergency department she had an episode of dysphagia, choking followed by vomiting associated with this chest discomfort.    her past medical history is pertinent for adenocarcinoma of the right lung which is currently being monitored only due to stability.  Coronary artery disease status post acute myocardial infarction with PCI in 2011.  Hyperlipidemia and hypertension, fibromyalgia, hypothyroidism.   ROS   Constitutional  generalized fatigue and malaise since her last round of  chemotherapy.  Significant sleep disturbance   Infectious  no fevers or chills   Resp  no cough, no congestion, no dyspnea   Cardiac  no palpitations, no exertional chest pressure or dyspnea, no orthopnea, no PND   GI  mild reflux symptoms since beginning chemotherapy not currently treated.  No nausea or vomiting prior to this episode.  Occasional dysphagia symptoms resulting in having to clear her airway with drinking thin liquids, patient reports she thinks this may be from her   GU  positive for dysuria, no UTI that has not been treated   Psych  no change in mood or after   Neuro  no unilateral weakness, no dysarthria, no facial   MSK  generalized bleeding week, fibromyalgia   Trauma  no falls reported     HISTORY  PMHx:  Past Medical History  Diagnosis Date  . Hypertension   . lung ca dx'd 04/2009  . MI (myocardial infarction)   . Presbycusis 04/10/2013    Eval from [facility] done 5/14 shows typical presbycusis with bilateral high frequency hearing loss.     . OSTEOARTHRITIS 04/26/2009  . OBESITY 10/01/2007  . Insomnia 12/03/2012  . HYPOTHYROIDISM 04/26/2009    Qualifier: Diagnosis of  By: Vernie Murders    . HYPERTENSION, BENIGN 10/01/2007    Qualifier: Diagnosis of  By: Yetta Barre CNA/MA, Shanda Bumps    . HYPERLIPIDEMIA 04/26/2009    Qualifier: Diagnosis of  By: Vernie Murders    . Hyperglycemia 12/03/2012    A1C in 05/2012=6.4 on no meds   .  Coronary artery disease 12/03/2012    Had acute mi 10/2010.  Had five stents placed in [place].     . Anemia 03/12/2013  . ADENOCARCINOMA, LUNG 05/20/2009    Has lung cancer.  Will be followed by cancer center.  Right now, no treatments, but will be reassessed soon at cancer center.      PSHx: Past Surgical History  Procedure Laterality Date  . Coronary stent placement      Social Hx: History   Social History  . Marital Status: Widowed    Spouse Name: N/A    Number of Children: N/A  . Years of Education: N/A   Social History Main Topics  .  Smoking status: Former Smoker    Quit date: 12/03/1973  . Smokeless tobacco: Never Used  . Alcohol Use: No  . Drug Use: No  . Sexually Active: Not Currently   Other Topics Concern  . None   Social History Narrative  . None    Family Hx: History reviewed. No pertinent family history.  Allergies: Allergies  Allergen Reactions  . Zelboraf (Vemurafenib)     Per Dr Arbutus Ped note - intolerance to Zelboraf.    Home Medications: Prescriptions prior to admission  Medication Sig Dispense Refill  . aspirin 81 MG chewable tablet Chew 162 mg by mouth once.      Marland Kitchen aspirin 81 MG tablet Take 81 mg by mouth daily.      . cholecalciferol (VITAMIN D) 400 UNITS TABS Take 400 Units by mouth daily.      . ciclopirox (LOPROX) 0.77 % cream Apply 1 application topically daily as needed (for rash on feet). Uses for feet.      . diclofenac sodium (VOLTAREN) 1 % GEL Apply 4 g topically 4 (four) times daily as needed (for knee pain). Uses on knees      . folic acid (FOLVITE) 1 MG tablet Take 1 tablet (1 mg total) by mouth daily.  30 tablet  4  . hydrochlorothiazide (MICROZIDE) 12.5 MG capsule Take 1 capsule (12.5 mg total) by mouth daily.  90 capsule  3  . levothyroxine (SYNTHROID, LEVOTHROID) 88 MCG tablet Take 1 tablet (88 mcg total) by mouth daily.  90 tablet  3  . metoprolol tartrate (LOPRESSOR) 25 MG tablet Take 1 tablet (25 mg total) by mouth 2 (two) times daily.  180 tablet  3  . pravastatin (PRAVACHOL) 40 MG tablet Take 1.5 tablets (60 mg total) by mouth daily.  135 tablet  3  . traMADol (ULTRAM) 50 MG tablet Take 50 mg by mouth every 6 (six) hours as needed for pain.      . traZODone (DESYREL) 50 MG tablet Take 25-50 mg by mouth at bedtime as needed for sleep.      . cephALEXin (KEFLEX) 500 MG capsule Take 1 capsule (500 mg total) by mouth 3 (three) times daily.  30 capsule  0    OBJECTIVE  Vitals: Temp:  [97.7 F (36.5 C)-98.8 F (37.1 C)] 98.8 F (37.1 C) (06/18 1419) Pulse Rate:   [76-94] 84 (06/18 1419) Resp:  [8-26] 20 (06/18 1419) BP: (131-177)/(75-95) 177/95 mmHg (06/18 1419) SpO2:  [92 %-97 %] 96 % (06/18 1419) Weight:  [216 lb 1.6 oz (98.022 kg)-218 lb 4 oz (98.998 kg)] 216 lb 1.6 oz (98.022 kg) (06/18 1419)  Weight: Baseline Weight: 216   Filed Weights   05/13/13 0820 05/13/13 1419  Weight: 218 lb 4 oz (98.998 kg) 216 lb 1.6 oz (98.022 kg)  I&Os:  Intake/Output Summary (Last 24 hours) at 05/13/13 1501 Last data filed at 05/13/13 1305  Gross per 24 hour  Intake    500 ml  Output      0 ml  Net    500 ml    PE: GENERAL:  Adult obese AA  female. In no discomfort; no respiratory distress. PSYCH: Alert and appropriately interactive; Insight:Good   H&N: AT/[place], trachea midline, no obvious thyromegaly EENT:  MMM, no scleral icterus, EOMi HEART: RRR, S1/S2 heard, no murmur LUNGS: no wheezes, scattered crackles, no focal consolidation ABDOMEN: +BS, soft, non-tender, no rigidity, no guarding, no masses/organomegaly EXTREMITIES: Moves all 4 extremities spontaneously, warm well perfused, no edema, bilateral DP and PT pulses 2/4.     LABS: Daily Others:   Recent Labs Lab 05/11/13 0819 05/13/13 0825  WBC 5.0 9.8  HGB 11.5* 10.5*  HCT 36.2 33.0*  PLT 294 254    Recent Labs Lab 05/11/13 0818 05/13/13 0825  NA 141 139  K 3.5 3.3*  CL 104 103  CO2 27 26  BUN 12.1 15  CREATININE 1.2* 1.31*  GLUCOSE 126* 111*  CALCIUM 9.2 9.1    Recent Labs Lab 05/13/13 0829 05/13/13 1046 05/13/13 1345  TROPIPOC 0.01 0.02 0.02    05/13/2013 08:25  INR 1.01     URINE STUDIES:  05/13/2013 09:59  Color, Urine BROWN (A)  APPearance CLOUDY (A)  Specific Gravity, Urine 1.022  pH 6.0  Glucose NEGATIVE  Bilirubin Urine LARGE (A)  Ketones, ur 15 (A)  Protein >300 (A)  Urobilinogen, UA 1.0  Nitrite POSITIVE (A)  Leukocytes, UA MODERATE (A)  Hgb urine dipstick LARGE (A)  WBC, UA 11-20  RBC / HPF 0-2  Squamous Epithelial / LPF RARE  Bacteria, UA  MANY (A)  Casts GRANULAR CAST (A)    MICRO: 6/11 - Urine Cx >>> Proteus - pan sensitive - FINAL 6/18 - Urine Cx >>>   IMAGING: 6/18 - CXR - R masses unchanged, no acute   02/08/12 - 2D ECHO - LVEF 68% with mild diastolic dysfunction, mild aortic and mitral regurg   Medications:   . sodium chloride 1,000 mL (05/13/13 0811)   . [START ON 05/14/2013] aspirin  81 mg Oral Daily  . cefTRIAXone (ROCEPHIN)  IV  1 g Intravenous Q24H  . famotidine (PEPCID) IV  20 mg Intravenous Once  . glucagon  0.25 mg Intravenous Once  . heparin  5,000 Units Subcutaneous Q8H  . hydrochlorothiazide  12.5 mg Oral Daily  . levothyroxine  88 mcg Oral QAC breakfast  . metoprolol tartrate  25 mg Oral BID  . pantoprazole (PROTONIX) IV  80 mg Intravenous Q24H  . simvastatin  30 mg Oral q1800   acetaminophen, nitroGLYCERIN, ondansetron (ZOFRAN) IV, traZODone  Assessment & Plan    * Chest Pain: Pt with acute onset of chest pain lasting ~86minutes. Resolved at this time  Hx of PCI.  Needs ACS eval.  Neg troponin X 2 without EKG changes.  Has taken ASA. 325 today.   [ ]  f/u CEs X 1 more given > 8 hours since onset and 3 neg thus far [ ]  f/u A1c, TSH [ ]  change to CRESTOR at D/c - need High potency due to prior MI, on pravachol at home; does not want LIPITOR    * UTI: Proteus UTI however pt is NPO at this time.  Repeat cx pending. - IV Rocephin [ ]  convert to PO regimen when tolerating PO    *  Dysphagia / Reflux:  This is likely the underlying etiology of her chest discomfort.  [ ]  ST to eval for dysphagia - Glucagon for esophageal spasm - High Dose PPI and H2 blocker    * HTN, CAD: Continue home BP meds when taking PO.  No hx of CHF - continue to monitor.  [ ]  consider IV metoprolol if persistently elevated   * Adenocarcinoma of Right Lung, stage IV: Patient has been [facility] at [facility] and by Dr. Arbutus Ped.  Currently stable with surveillance only.  Last tx was 5/27 but now finished - consider  intrathoracic complications due to metastatic disease.  No evidence of thoracic outlet syndrome.  Continue to  monitor   * CKD stage 2: Cr at admission 1.3.  Baseline 1.2 - IV Fluids    * Anemia: required frequent transfusions with chemo.  Hb today 10.5 [ ]  trend CBC in AM   * Hypothyroid, HLD: No hx of Diabetes - no indication for Lipid profile as needs high potency statin at d/c [ ]  see above risk labs,   --- FEN:  - NS @ 125 - NPO until seen by ST --- PPx: Heparin, PPI, H2  --- DISPOSITION: Place in tele for ACS eval; No lab or EKG changes.  Tx UTI.  Tx for esophogeal spasm. ST to see.  Planned d/c to home in AM   Andrena Mews, DO [facility] Resident - PGY-2 05/13/2013 3:01 PM

## 2013-05-13 NOTE — ED Notes (Signed)
Dr. Davidson at bedside.

## 2013-05-13 NOTE — Telephone Encounter (Signed)
I treated it.  Nothing for you to do.

## 2013-05-13 NOTE — ED Provider Notes (Signed)
History     CSN: [id number]  Arrival date & time 05/13/13  8295   First MD Initiated Contact with Patient 05/13/13 [phone]      Chief Complaint  Patient presents with  . Chest Pain    (Consider location/radiation/quality/duration/timing/severity/associated sxs/prior treatment) Patient is a 79 y.o. female presenting with chest pain. The history is provided by the patient and medical records. No language interpreter was used.  Chest Pain Pain location:  Substernal area Pain quality: aching and dull   Pain quality comment:  Pt had substernal chest pain that lasted about 30 minutes, associated with dizziness and nausea.  It was bad enough that she asked her daugther to call EMS.  Pt was pain free by the time EMS got to her. Pain radiates to:  Does not radiate Pain radiates to the back: no   Pain severity:  Moderate Onset quality:  Sudden Duration:  30 minutes Timing:  Constant Progression:  Resolved Chronicity:  New Context comment:  No apparent precipitating cause. Relieved by:  Nothing Worsened by:  Nothing tried Ineffective treatments:  None tried Associated symptoms: dizziness and nausea   Associated symptoms: no fever and no shortness of breath   Risk factors: coronary artery disease   Risk factors comment:  Lung cancer   Past Medical History  Diagnosis Date  . Hypertension   . lung ca dx'd 04/2009  . MI (myocardial infarction)     Past Surgical History  Procedure Laterality Date  . Coronary stent placement      History reviewed. No pertinent family history.  History  Substance Use Topics  . Smoking status: Former Smoker    Quit date: 12/03/1973  . Smokeless tobacco: Never Used  . Alcohol Use: No    OB History   Grav Para Term Preterm Abortions TAB SAB Ect Mult Living                  Review of Systems  Constitutional: Negative for fever and chills.  HENT: Negative.   Eyes: Negative.   Respiratory: Negative.  Negative for shortness of breath.    Cardiovascular: Positive for chest pain.  Gastrointestinal: Positive for nausea.  Genitourinary: Negative.   Musculoskeletal: Negative.   Skin: Negative.   Neurological: Positive for dizziness.  Psychiatric/Behavioral: Negative.     Allergies  Zelboraf  Home Medications   Current Outpatient Rx  Name  Route  Sig  Dispense  Refill  . aspirin 81 MG chewable tablet   Oral   Chew 162 mg by mouth once.         Marland Kitchen aspirin 81 MG tablet   Oral   Take 81 mg by mouth daily.         . cholecalciferol (VITAMIN D) 400 UNITS TABS   Oral   Take 400 Units by mouth daily.         . ciclopirox (LOPROX) 0.77 % cream   Topical   Apply 1 application topically daily as needed (for rash on feet). Uses for feet.         . diclofenac sodium (VOLTAREN) 1 % GEL   Topical   Apply 4 g topically 4 (four) times daily as needed (for knee pain). Uses on knees         . folic acid (FOLVITE) 1 MG tablet   Oral   Take 1 tablet (1 mg total) by mouth daily.   30 tablet   4   . hydrochlorothiazide (MICROZIDE) 12.5 MG capsule  Oral   Take 1 capsule (12.5 mg total) by mouth daily.   90 capsule   3   . levothyroxine (SYNTHROID, LEVOTHROID) 88 MCG tablet   Oral   Take 1 tablet (88 mcg total) by mouth daily.   90 tablet   3   . metoprolol tartrate (LOPRESSOR) 25 MG tablet   Oral   Take 1 tablet (25 mg total) by mouth 2 (two) times daily.   180 tablet   3   . pravastatin (PRAVACHOL) 40 MG tablet   Oral   Take 1.5 tablets (60 mg total) by mouth daily.   135 tablet   3   . traMADol (ULTRAM) 50 MG tablet   Oral   Take 50 mg by mouth every 6 (six) hours as needed for pain.         . traZODone (DESYREL) 50 MG tablet   Oral   Take 25-50 mg by mouth at bedtime as needed for sleep.           BP 131/78  Pulse 84  Temp(Src) 97.7 F (36.5 C) (Oral)  SpO2 94%  Physical Exam  Nursing note and vitals reviewed. Constitutional: She is oriented to person, place, and time.   Pleasant elderly woman, in no distress at rest.  HENT:  Head: Normocephalic and atraumatic.  Right Ear: External ear normal.  Left Ear: External ear normal.  Mouth/Throat: Oropharynx is clear and moist.  Eyes: Conjunctivae and EOM are normal. Pupils are equal, round, and reactive to light.  Neck: Normal range of motion. Neck supple.  Cardiovascular: Normal rate, regular rhythm and normal heart sounds.   Pulmonary/Chest: Effort normal and breath sounds normal. She has no wheezes. She has no rales. She exhibits no tenderness.  Abdominal: Soft. Bowel sounds are normal.  Musculoskeletal: Normal range of motion. She exhibits no edema and no tenderness.  Neurological: She is alert and oriented to person, place, and time.  No sensory or motor deficit.  Skin: Skin is warm and dry.  Psychiatric: She has a normal mood and affect. Her behavior is normal.    ED Course  Procedures (including critical care time)  7:36 AM  Date: 05/13/2013  Rate: 80  Rhythm: normal sinus rhythm and premature ventricular contractions (PVC)  QRS Axis: left  Intervals: normal QRS:  Poor R wave progression in precordial leads suggests possible old anterior myocardial infarction.  Left atrial abnormality.  ST/T Wave abnormalities: normal  Conduction Disutrbances:none  Narrative Interpretation: Abnormal EKG  Old EKG Reviewed: none available  7:51 AM Pt was seen and had physical examination.  Lab workup for chest pain was initiated.  Results for orders placed during the hospital encounter of 05/13/13  CBC      Result Value Range   WBC 9.8  4.0 - 10.5 K/uL   RBC 3.63 (*) 3.87 - 5.11 MIL/uL   Hemoglobin 10.5 (*) 12.0 - 15.0 g/dL   HCT 16.1 (*) 09.6 - 04.5 %   MCV 90.9  78.0 - 100.0 fL   MCH 28.9  26.0 - 34.0 pg   MCHC 31.8  30.0 - 36.0 g/dL   RDW 40.9 (*) 81.1 - 91.4 %   Platelets 254  150 - 400 K/uL  COMPREHENSIVE METABOLIC PANEL      Result Value Range   Sodium 139  135 - 145 mEq/L   Potassium 3.3 (*)  3.5 - 5.1 mEq/L   Chloride 103  96 - 112 mEq/L   CO2 26  19 -  32 mEq/L   Glucose, Bld 111 (*) 70 - 99 mg/dL   BUN 15  6 - 23 mg/dL   Creatinine, Ser 1.61 (*) 0.50 - 1.10 mg/dL   Calcium 9.1  8.4 - 09.6 mg/dL   Total Protein 8.1  6.0 - 8.3 g/dL   Albumin 3.5  3.5 - 5.2 g/dL   AST 48 (*) 0 - 37 U/L   ALT 22  0 - 35 U/L   Alkaline Phosphatase 84  39 - 117 U/L   Total Bilirubin 1.0  0.3 - 1.2 mg/dL   GFR calc non Af Amer 37 (*) >90 mL/min   GFR calc Af Amer 42 (*) >90 mL/min  PROTIME-INR      Result Value Range   Prothrombin Time 13.2  11.6 - 15.2 seconds   INR 1.01  0.00 - 1.49  APTT      Result Value Range   aPTT 31  24 - 37 seconds  URINALYSIS, ROUTINE W REFLEX MICROSCOPIC      Result Value Range   Color, Urine BROWN (*) YELLOW   APPearance CLOUDY (*) CLEAR   Specific Gravity, Urine 1.022  1.005 - 1.030   pH 6.0  5.0 - 8.0   Glucose, UA NEGATIVE  NEGATIVE mg/dL   Hgb urine dipstick LARGE (*) NEGATIVE   Bilirubin Urine LARGE (*) NEGATIVE   Ketones, ur 15 (*) NEGATIVE mg/dL   Protein, ur >045 (*) NEGATIVE mg/dL   Urobilinogen, UA 1.0  0.0 - 1.0 mg/dL   Nitrite POSITIVE (*) NEGATIVE   Leukocytes, UA MODERATE (*) NEGATIVE  URINE MICROSCOPIC-ADD ON      Result Value Range   Squamous Epithelial / LPF RARE  RARE   WBC, UA 11-20  <3 WBC/hpf   RBC / HPF 0-2  <3 RBC/hpf   Bacteria, UA MANY (*) RARE   Casts GRANULAR CAST (*) NEGATIVE  TROPONIN I      Result Value Range   Troponin I <0.30  <0.30 ng/mL  TSH      Result Value Range   TSH 2.994  0.350 - 4.500 uIU/mL  HEMOGLOBIN A1C      Result Value Range   Hemoglobin A1C 5.7 (*) <5.7 %   Mean Plasma Glucose 117 (*) <117 mg/dL  POCT I-STAT TROPONIN I      Result Value Range   Troponin i, poc 0.01  0.00 - 0.08 ng/mL   Comment 3           POCT I-STAT TROPONIN I      Result Value Range   Troponin i, poc 0.02  0.00 - 0.08 ng/mL   Comment 3           POCT I-STAT TROPONIN I      Result Value Range   Troponin i, poc 0.02  0.00  - 0.08 ng/mL   Comment 3            Ct Chest W Contrast  05/08/2013   *RADIOLOGY REPORT*  Clinical Data:  Lung cancer diagnosed 2010.  Chemotherapy in progress.  Recent therapy complete.  CT CHEST, ABDOMEN AND PELVIS WITH CONTRAST  Technique:  Multidetector CT imaging of the chest, abdomen and pelvis was performed following the standard protocol during bolus administration of intravenous contrast.  Contrast: OMNIPAQUE IOHEXOL 300 MG/ML  SOLN  Comparison:  CT 03/06/2013  CT CHEST  Findings:  No axillary or supraclavicular lymphadenopathy.  14 mm right lower paratracheal lymph node is similar to 14 mm  on prior. 11 mm left paratracheal lymph node is unchanged.  The large right perihilar mass measures slightly larger at 7.5 x 4.7 cm compared to 8.0 x 5.0 cm on comparison from 03/06/2013. Ground-glass nodule in the right lower lobe measures 27 mm x 26 mm decreased slightly from 37 x 34 mm on prior.  Other small ground- glass nodules and solid nodules are not significantly changed. Pleural-based nodule with peripheral ground-glass opacity in the left lower lobe measures 19 mm compared to 22 mm on prior.  There are no new pulmonary nodules.  IMPRESSION:  1.  Overall stable bilateral pulmonary and mediastinal nodal metastases with no evidence disease progression. 2.  Large right perihilar mass is slightly decreased in volume compared to prior. 3.  Several of the larger nodules that measures slightly smaller than prior. 4.  Smaller bilateral pulmonary metastasis are unchanged.  CT ABDOMEN AND PELVIS  Findings:  No focal hepatic lesion.  Post cholecystectomy.  The pancreas, spleen, and right adrenal gland are normal.  There is a nodule measuring  2.2 x 2.5  cm involving the left adrenal gland which are not changed from 2.5 x 2.1 cm on prior.  The stomach, small bowel, appendix, and cecum are normal.  The colon and rectosigmoid colon are normal.  Abdominal aorta is normal caliber.  No retroperitoneal periportal  lymphadenopathy.  No mesenteric or peritoneal metastasis.  No free fluid the pelvis.  The bladder and uterus are normal.  No pelvic lymphadenopathy. Review of  bone windows demonstrates no aggressive osseous lesions.  IMPRESSION:  1.  Stable nodule of the left adrenal gland. 2.  No evidence of metastatic disease progression in the abdomen or pelvis.   Original Report Authenticated By: Genevive Bi, M.D.   Ct Abdomen Pelvis W Contrast  05/08/2013   *RADIOLOGY REPORT*  Clinical Data:  Lung cancer diagnosed 2010.  Chemotherapy in progress.  Recent therapy complete.  CT CHEST, ABDOMEN AND PELVIS WITH CONTRAST  Technique:  Multidetector CT imaging of the chest, abdomen and pelvis was performed following the standard protocol during bolus administration of intravenous contrast.  Contrast: OMNIPAQUE IOHEXOL 300 MG/ML  SOLN  Comparison:  CT 03/06/2013  CT CHEST  Findings:  No axillary or supraclavicular lymphadenopathy.  14 mm right lower paratracheal lymph node is similar to 14 mm on prior. 11 mm left paratracheal lymph node is unchanged.  The large right perihilar mass measures slightly larger at 7.5 x 4.7 cm compared to 8.0 x 5.0 cm on comparison from 03/06/2013. Ground-glass nodule in the right lower lobe measures 27 mm x 26 mm decreased slightly from 37 x 34 mm on prior.  Other small ground- glass nodules and solid nodules are not significantly changed. Pleural-based nodule with peripheral ground-glass opacity in the left lower lobe measures 19 mm compared to 22 mm on prior.  There are no new pulmonary nodules.  IMPRESSION:  1.  Overall stable bilateral pulmonary and mediastinal nodal metastases with no evidence disease progression. 2.  Large right perihilar mass is slightly decreased in volume compared to prior. 3.  Several of the larger nodules that measures slightly smaller than prior. 4.  Smaller bilateral pulmonary metastasis are unchanged.  CT ABDOMEN AND PELVIS  Findings:  No focal hepatic lesion.   Post cholecystectomy.  The pancreas, spleen, and right adrenal gland are normal.  There is a nodule measuring  2.2 x 2.5  cm involving the left adrenal gland which are not changed from 2.5 x 2.1 cm on  prior.  The stomach, small bowel, appendix, and cecum are normal.  The colon and rectosigmoid colon are normal.  Abdominal aorta is normal caliber.  No retroperitoneal periportal lymphadenopathy.  No mesenteric or peritoneal metastasis.  No free fluid the pelvis.  The bladder and uterus are normal.  No pelvic lymphadenopathy. Review of  bone windows demonstrates no aggressive osseous lesions.  IMPRESSION:  1.  Stable nodule of the left adrenal gland. 2.  No evidence of metastatic disease progression in the abdomen or pelvis.   Original Report Authenticated By: Genevive Bi, M.D.   Dg Chest Portable 1 View  05/13/2013   *RADIOLOGY REPORT*  Clinical Data: Chest pain.  History of myocardial infarction and hypertension.  PORTABLE CHEST - 1 VIEW  Comparison: CTs dated 03/06/2013 and 05/08/2013.  Findings: 07:50 hours.  Large right perihilar mass does not appear significantly changed, measuring approximately 6.0 x 5.0 cm.  No other pulmonary nodules are identified.  Patchy right basilar opacity appears unchanged.  Heart size and mediastinal contours are stable.  There is no pleural effusion or pneumothorax.  Telemetry leads overlie the chest.  IMPRESSION: No significant change in right perihilar mass and right basilar pulmonary opacity.  Per previous reports, there is a known history of lung cancer.  No acute superimposed findings identified.   Original Report Authenticated By: Carey Bullocks, M.D.    Lab workup showed no acute myocardial infarction.  Where she had a history of coronary artery disease and prior cardiac stents, I requested Family Practice to admit her for chest pain observation.  1. Urge incontinence of urine   2. Coronary artery disease   3. Essential hypertension, benign   4. Malignant  neoplasm of bronchus and lung, unspecified site   5. Obesity, unspecified   6. Chest pain       Carleene Cooper III, MD 05/14/13 [phone]

## 2013-05-13 NOTE — ED Notes (Signed)
Dr Jennette Kettle states pt can have clear liquids

## 2013-05-13 NOTE — Telephone Encounter (Signed)
Pt is admitted in hospital for ACS eval.  Will treat

## 2013-05-13 NOTE — ED Notes (Signed)
Reported to Leotis Shames, RN  That EMT, Malachi Bonds reported that pt. Is having difficulty swallowing and vomited X1.

## 2013-05-13 NOTE — ED Notes (Signed)
[facility]- chest pain since this am.  Hx of lung ca.  Stopped chemo last month.  Hx of mi with stents in 2011.  Took ASA 325mg  pta ems arrival.  When ems arrived pt was chest pain free.  20g left ac

## 2013-05-13 NOTE — ED Notes (Signed)
Portable xray at bedside.

## 2013-05-13 NOTE — ED Notes (Signed)
Pt transported to floor via Malachi Bonds

## 2013-05-13 NOTE — Telephone Encounter (Signed)
Attempted to call pt.  Not at home.  Will try again.  Buel Molder, Darlyne Russian, CMA

## 2013-05-13 NOTE — ED Notes (Signed)
Pt ambulated in hall to restroom without difficulty; Pt denies chest pain at this time. NAD noted

## 2013-05-14 ENCOUNTER — Inpatient Hospital Stay (HOSPITAL_COMMUNITY): Payer: PRIVATE HEALTH INSURANCE

## 2013-05-14 DIAGNOSIS — N390 Urinary tract infection, site not specified: Secondary | ICD-10-CM | POA: Diagnosis present

## 2013-05-14 DIAGNOSIS — R079 Chest pain, unspecified: Secondary | ICD-10-CM | POA: Diagnosis present

## 2013-05-14 DIAGNOSIS — K222 Esophageal obstruction: Secondary | ICD-10-CM | POA: Diagnosis present

## 2013-05-14 LAB — GLUCOSE, CAPILLARY: Glucose-Capillary: 70 mg/dL (ref 70–99)

## 2013-05-14 MED ORDER — DEXTROSE-NACL 5-0.9 % IV SOLN
INTRAVENOUS | Status: DC
  Administered 2013-05-14: 23:00:00 via INTRAVENOUS

## 2013-05-14 MED ORDER — SODIUM CHLORIDE 0.9 % IV SOLN: INTRAVENOUS | Status: DC

## 2013-05-14 MED ORDER — PANTOPRAZOLE SODIUM 40 MG IV SOLR
40.0000 mg | Freq: Two times a day (BID) | INTRAVENOUS | Status: DC
  Administered 2013-05-14 – 2013-05-16 (×5): 40 mg via INTRAVENOUS
  Filled 2013-05-14 (×6): qty 40

## 2013-05-14 MED ORDER — METOPROLOL TARTRATE 25 MG PO TABS
25.0000 mg | ORAL_TABLET | Freq: Two times a day (BID) | ORAL | Status: DC
  Administered 2013-05-14 – 2013-05-16 (×4): 25 mg via ORAL
  Filled 2013-05-14 (×6): qty 1

## 2013-05-14 NOTE — Progress Notes (Signed)
Patient ID: Shannon Camacho, female   DOB: 04/27/1928, 79 y.o.   MRN: [id number]  Barium swallow results noted. Will plan to do EGD with possible dilation tomorrow (time to be determined). 

## 2013-05-14 NOTE — Progress Notes (Signed)
Family Medicine Teaching Service Daily Progress Note Intern Pager: [phone]  Patient name: Shannon Camacho Medical record number: [id number] Date of birth: [birth year] Age: 79 y.o. Gender: female  Primary Care Provider: Sanjuana Letters, MD (Primary Oncologist Dr. Arbutus Ped) Consult[facility] - GI (Dr. Bosie Clos)  Pt Overview and Major Events to Date: 80 y.o. year old female presenting with 8 hour onset of chest discomfort consistent with esophageal dysfunction. Recent evaluation for dysuria and frequency found to have a Proteus UTI that has been untreated. Vital signs and labs reassuring at admission.   6/18 - admit for ACS eval and UTI; treated with Rocephin, PPI/H2-blocker, glucagon for ?esophageal spasm 6/19 - GI consulted for concern for esophageal dysmotility  Assessment and Plan: Pt currently still with difficulty swallowing, though frank chest pain is resolved. Otherwise feels well.  #Chest pain evaluation  - acute onset 7/19, lasting ~30 minutes; not likely cardiac -on ASA with hx of PCI, with 2 neg troponins x2 and no EKG changes in the ED; serum troponin negative as well -risk stratification labs  -TSH 2.994 (wnl)  -Hb A1c 5.7 (no indication for medication)  #Dysphagia - likely direct contributor to/cause of chest pain; pain and regurgitation especially with liquids -s/p glucacon x1 6/19 for suspected esophageal spasm -SLP recommended GI consult, appreciated recommendations -continue PPI (Protonix, changed from 80 mg daily to 40 mg BID), continue Pepcid daily [ ]  NPO for now [ ]  f/u barium swallow evaluation, possible EGD +/- dilation [ ]  f/u further GI recommendations [ ]  may consider CCB (?diltiazem)  #UTI - Proteus seen on culture from clinic visit 6/11; untreated (planned tx with Keflex 10 mg for 10 days) -continue Rocephin 1g q24 (6/19 is day 2 of planned 3 of Rocephin vs completion of course with PO abx) [ ]  f/u repeat culture (shows Proteus, sens pending)  #HTN, CAD -  continuing home medications (ASA, HCTZ, metoprolol)  #CKD, stage II - admission Cr 1.3 (baseline ~1.2) -continue IVF especially while NPO [ ]  monitor Cr trend  #Hx of adenocarcinoma of right lung, stage IV - previously followed at [facility]; also followed by Dr. Arbutus Ped -last tx 04/21/2013, now finished, with surveillance only -intrathoracic disease could be contributing to dysphagia -will consider oncology consult if needed  #Anemia - chronic; pt required transfusions frequency while on chemotherapy for the aboev -Hb on admission 10.5 [ ]  trend Hb  #HLD - Continue Zocor (formulary substitute for Pravachol) -would benefit from high-potency statin given hx of MI; plan to switch to Crestor at D/C (pt does not want Lipitor)  #Hypothyroidism - TSH as above; continue Synthroid 88 mcg daily  FEN/GI: NPO except for sips with meds, IVF NS at 125 mL/h PPx: SubQ heparain, PPI, H2-blocker as above Code Status: need to clarify with pt  Disposition: Management as above. Discharge pending further GI work-up/therapies.  Subjective: Pt seen at bedside. States she feels well this morning. No further chest pain. No SOB, no abd pain. Pt does state a feeling of a "knot" in her throat/upper chest and states she takes mediation at home for "lung phlegm" that helps  Objective: Temp:  [97.9 F (36.6 C)-99.4 F (37.4 C)] 98.4 F (36.9 C) (06/19 0400) Pulse Rate:  [83-94] 83 (06/19 0400) Resp:  [8-26] 20 (06/19 0400) BP: (149-177)/(57-95) 163/75 mmHg (06/19 0400) SpO2:  [91 %-97 %] 96 % (06/19 0400) Weight:  [216 lb 1.6 oz (98.022 kg)] 216 lb 1.6 oz (98.022 kg) (06/18 1419) Exam: General: elderly adult female, obese, in NAD Cardiovascular: RRR,  no murmur appreciated Respiratory: CTAB, no wheezes, normal WOB Abdomen: BS+, soft, nontender Extremities: moves all extremities equally; warm/well-perfused, trace edema bilaterally in LE (at baseline per pt)  Laboratory:  Recent Labs Lab 05/11/13 0819  05/13/13 0825  WBC 5.0 9.8  HGB 11.5* 10.5*  HCT 36.2 33.0*  PLT 294 254    Recent Labs Lab 05/11/13 0818 05/13/13 0825  NA 141 139  K 3.5 3.3*  CL 104 103  CO2 27 26  BUN 12.1 15  CREATININE 1.2* 1.31*  CALCIUM 9.2 9.1  PROT 8.3 8.1  BILITOT 0.71 1.0  ALKPHOS 79 84  ALT 23 22  AST 36* 48*  GLUCOSE 126* 111*    Recent Labs Lab 05/13/13 1630  TROPONINI <0.30  Negative POC troponin 6/18 x2  Urinalysis 6/18  05/13/2013 09:59  Color, Urine BROWN (A)  APPearance CLOUDY (A)  Specific Gravity, Urine 1.022  pH 6.0  Glucose NEGATIVE  Bilirubin Urine LARGE (A)  Ketones, ur 15 (A)  Protein >300 (A)  Urobilinogen, UA 1.0  Nitrite POSITIVE (A)  Leukocytes, UA MODERATE (A)  Hgb urine dipstick LARGE (A)  WBC, UA 11-20  RBC / HPF 0-2  Squamous Epithelial / LPF RARE  Bacteria, UA MANY (A)  Casts GRANULAR CAST (A)   Microbiology:  Urine culture 6/11 (clinic) - Proteus (pan-sensitive except to nitrofurantoin)  Urine culture 6/18 - >100k CFU/mL Proteus, sensitivities pending  Imaging/Diagnostic Tests: EKG 6/19 - baseline wander but no acute change from previous; NSR, no frank ischemia CXR 6/18 - no acute change in right-sided masses (known/treated lung cancer); no other consolidations  Bobbye Morton, MD 05/14/2013, 9:38 AM PGY-1, [facility] FPTS Intern pager: [phone]

## 2013-05-14 NOTE — Consult Note (Signed)
Referring Provider: Dr. Jennette Kettle Primary Care Physician:  Sanjuana Letters, MD Primary Gastroenterologist:  Gentry Fitz  Reason for Consultation:  Dysphagia  HPI: Shannon Camacho is a 79 y.o. female with known history of metastatic non-small cell lung cancer on chemotherapy until 2 weeks ago. Denies previous radiation treatment. Chemotherapy was stopped 2 weeks ago due to intolerance. Chest pressure developed and persisted and she came to the ER for evaluation. She has been having intermittent trouble swallowing foods where while eating she will be doing ok and then may have a bite that feels like it hangs up. Denies trouble swallowing liquids unless in conjunction with eating. Sometimes pills hang up. Denies heartburn. Has not had workup for this done stating that although it has been present for over a year she never told her doctors. She feels that the trouble swallowing got worse after starting chemotherapy. Denies weight loss. Has been able to swallow her saliva. Felt like her pill she was recently given did not go down.   Past Medical History  Diagnosis Date  . Hypertension   . lung ca dx'd 04/2009  . MI (myocardial infarction)   . Presbycusis 04/10/2013    Eval from [facility] done 5/14 shows typical presbycusis with bilateral high frequency hearing loss.     . OSTEOARTHRITIS 04/26/2009  . OBESITY 10/01/2007  . Insomnia 12/03/2012  . HYPOTHYROIDISM 04/26/2009    Qualifier: Diagnosis of  By: Vernie Murders    . HYPERTENSION, BENIGN 10/01/2007    Qualifier: Diagnosis of  By: Yetta Barre CNA/MA, Shanda Bumps    . HYPERLIPIDEMIA 04/26/2009    Qualifier: Diagnosis of  By: Vernie Murders    . Hyperglycemia 12/03/2012    A1C in 05/2012=6.4 on no meds   . Coronary artery disease 12/03/2012    Had acute mi 10/2010.  Had five stents placed in [place].     . Anemia 03/12/2013  . ADENOCARCINOMA, LUNG 05/20/2009    Has lung cancer.  Will be followed by cancer center.  Right now, no treatments, but will be reassessed  soon at cancer center.    . Shortness of breath   . GERD (gastroesophageal reflux disease)     Past Surgical History  Procedure Laterality Date  . Coronary stent placement    . Knee arthroplasty    . Cholecystectomy      Prior to Admission medications   Medication Sig Start Date End Date Taking? Authorizing Provider  aspirin 81 MG chewable tablet Chew 162 mg by mouth once.   Yes Historical Provider, MD  aspirin 81 MG tablet Take 81 mg by mouth daily.   Yes Historical Provider, MD  cholecalciferol (VITAMIN D) 400 UNITS TABS Take 400 Units by mouth daily.   Yes Historical Provider, MD  ciclopirox (LOPROX) 0.77 % cream Apply 1 application topically daily as needed (for rash on feet). Uses for feet.   Yes Historical Provider, MD  diclofenac sodium (VOLTAREN) 1 % GEL Apply 4 g topically 4 (four) times daily as needed (for knee pain). Uses on knees 12/03/12  Yes Sanjuana Letters, MD  folic acid (FOLVITE) 1 MG tablet Take 1 tablet (1 mg total) by mouth daily. 12/29/12  Yes Si Gaul, MD  hydrochlorothiazide (MICROZIDE) 12.5 MG capsule Take 1 capsule (12.5 mg total) by mouth daily. 03/11/13  Yes Sanjuana Letters, MD  levothyroxine (SYNTHROID, LEVOTHROID) 88 MCG tablet Take 1 tablet (88 mcg total) by mouth daily. 03/11/13  Yes Sanjuana Letters, MD  metoprolol tartrate (LOPRESSOR) 25 MG tablet Take 1  tablet (25 mg total) by mouth 2 (two) times daily. 03/11/13  Yes Sanjuana Letters, MD  pravastatin (PRAVACHOL) 40 MG tablet Take 1.5 tablets (60 mg total) by mouth daily. 04/22/13  Yes Sanjuana Letters, MD  traMADol (ULTRAM) 50 MG tablet Take 50 mg by mouth every 6 (six) hours as needed for pain. 03/11/13  Yes Sanjuana Letters, MD  traZODone (DESYREL) 50 MG tablet Take 25-50 mg by mouth at bedtime as needed for sleep. 12/03/12  Yes Sanjuana Letters, MD  cephALEXin (KEFLEX) 500 MG capsule Take 1 capsule (500 mg total) by mouth 3 (three) times daily. 05/13/13   Brent Bulla, MD     Scheduled Meds: . aspirin  81 mg Oral Daily  . cefTRIAXone (ROCEPHIN)  IV  1 g Intravenous Q24H  . heparin  5,000 Units Subcutaneous Q8H  . hydrochlorothiazide  12.5 mg Oral Daily  . levothyroxine  88 mcg Oral QAC breakfast  . metoprolol tartrate  25 mg Oral BID  . pantoprazole (PROTONIX) IV  80 mg Intravenous Q24H  . simvastatin  30 mg Oral q1800   Continuous Infusions: . sodium chloride 1,000 mL (05/13/13 0811)   PRN Meds:.acetaminophen, nitroGLYCERIN, ondansetron (ZOFRAN) IV, traZODone  Allergies as of 05/13/2013 - Review Complete 05/13/2013  Allergen Reaction Noted  . Zelboraf (vemurafenib)  12/31/2012    History reviewed. No pertinent family history.  History   Social History  . Marital Status: Widowed    Spouse Name: N/A    Number of Children: N/A  . Years of Education: N/A   Occupational History  . Not on file.   Social History Main Topics  . Smoking status: Former Smoker    Quit date: 12/03/1973  . Smokeless tobacco: Never Used  . Alcohol Use: No  . Drug Use: No  . Sexually Active: Not Currently   Other Topics Concern  . Not on file   Social History Narrative   Lives in [place] with Daughter    Review of Systems: All negative from a GI standpoint except as stated above in HPI. Admit H and P ROS reviewed and agree.  Physical Exam: Vital signs: Filed Vitals:   05/14/13 1002  BP: 143/80  Pulse: 91  Temp: 98.4  Resp: 20   Last BM Date: 05/12/13 General:   Elderly, Alert,  Well-developed, well-nourished, pleasant and cooperative in NAD HEENT: anicteric Lungs:  Clear throughout to auscultation.   No wheezes, crackles, or rhonchi. No acute distress. Heart:  Regular rate and rhythm; no murmurs, clicks, rubs,  or gallops. Abdomen: soft, nontender, nondistended, +BS  Rectal:  Deferred Ext: no edema  GI:  Lab Results:  Recent Labs  05/13/13 0825  WBC 9.8  HGB 10.5*  HCT 33.0*  PLT 254   BMET  Recent Labs  05/13/13 0825  NA 139   K 3.3*  CL 103  CO2 26  GLUCOSE 111*  BUN 15  CREATININE 1.31*  CALCIUM 9.1   LFT  Recent Labs  05/13/13 0825  PROT 8.1  ALBUMIN 3.5  AST 48*  ALT 22  ALKPHOS 84  BILITOT 1.0   PT/INR  Recent Labs  05/13/13 0825  LABPROT 13.2  INR 1.01     Studies/Results: Dg Chest Portable 1 View  05/13/2013   *RADIOLOGY REPORT*  Clinical Data: Chest pain.  History of myocardial infarction and hypertension.  PORTABLE CHEST - 1 VIEW  Comparison: CTs dated 03/06/2013 and 05/08/2013.  Findings: 07:50 hours.  Large right perihilar mass does  not appear significantly changed, measuring approximately 6.0 x 5.0 cm.  No other pulmonary nodules are identified.  Patchy right basilar opacity appears unchanged.  Heart size and mediastinal contours are stable.  There is no pleural effusion or pneumothorax.  Telemetry leads overlie the chest.  IMPRESSION: No significant change in right perihilar mass and right basilar pulmonary opacity.  Per previous reports, there is a known history of lung cancer.  No acute superimposed findings identified.   Original Report Authenticated By: Carey Bullocks, M.D.    Impression/Plan: 79 yo with met lung cancer being seen for a consult due to dysphagia in the setting of recent chemoRx. DDx esophageal dysmotility vs eso stricture vs eso ring. Doubt she has an esophageal spasm. Would start with a barium swallow and depending on those results she may need an EGD +/- dilation. Continue PPI 40 mg IV Q 12 hours.    LOS: 1 day   Aliya Sol C.  05/14/2013, 11:45 AM

## 2013-05-14 NOTE — Progress Notes (Signed)
Bedside Swallow Eval- Late Entry 05/13/13 Maryjo Rochester, SLP, signed in her absence.  Harlon Ditty, [place] CCC-SLP [phone]    05/13/13 1700  SLP Visit Information  SLP Received On 05/13/13  SLP Time Calculation  SLP Start Time 1700  Subjective  Subjective Pt. alert and hungry.   Patient/Family Stated Goal "I'm starving."  General Information  HPI No H & P available at this time.  Pt./family reports pt. was admitted with chest pain. RN reports w/u for ACS  PMH Lung cancer, MI, GERD; HTN; obesity.  Family reports intermittent difficulty keeping food down, but much worse today than usual.  Type of Study Bedside swallow evaluation  Previous Swallow Assessment None found in [facility] system  Diet Prior to this Study NPO  Temperature Spikes Noted No  Respiratory Status Room air  History of Recent Intubation No  Behavior/Cognition Alert;Cooperative;Pleasant mood  Oral Cavity - Dentition Adequate natural dentition  Self-Feeding Abilities Able to feed self  Patient Positioning Upright in bed  Baseline Vocal Quality Clear  Volitional Cough Strong  Volitional Swallow Able to elicit  Oral Assessment (Complete on admission/transfer/change in patient condition)  Does patient have any of the following "high risk" factors? None of the above  Does patient have any of the following "at risk" factors? None of the above  Brush patient's teeth BID with toothbrush (using toothpaste with fluoride) Yes  Oral Motor/Sensory Function  Overall Oral Motor/Sensory Function Appears within functional limits for tasks assessed  Thin Liquid  Thin Liquid Impaired  Presentation Cup  Other Comments No oropharyngeal difficulty noted, but water came back up approximately 30 seconds after swallowing.    Nectar Thick Liquid  Nectar Thick Liquid NT  Honey Thick Liquid  Honey Thick Liquid NT  Puree  Puree WFL  Presentation Self Fed;Spoon  Solid  Solid NT  SLP - End of Session  Activity Tolerance Decreased this session   Patient left in bed;with call bell/phone within reach;with family/visitor present  Nurse Communication Aspiration precautions reviewed;Diet recommendation  Suspected Esophageal Findings  Suspected Esophageal Findings Belching;Regurgitation;Globus sensation  SLP Assessment  Clinical Impression Statement Suspected primary esophageal dysphagia, with oropharyngeal phases of the swallow appearing to be normal at bedside.  Approximately 30 seconds after taking 2 swallows of water, the pt. regurgitated into emesis basin.  Pt. insisted on attempting applesauce, stating, "I'm hungry.  Please let me try."  Pt. swallowed 3 bites of puree that did not come back up, however, pt, did belch.  Question obstruction or esophageal stricture.  Risk for Aspiration Mild  Other Related Risk Factors History of GERD;History of esophageal-related issues;Decreased management of secretions  Swallow Evaluation Recommendations  Recommended Consults Consider GI evaluation;Consider esophageal assessment  Diet Recommendations NPO  Follow up Recommendations None;Other (comment) (Defer to GI)  Treatment Plan  Treatment Plan Recommendations No treatment recommended at this time (Defer to GI)  Individuals Consulted  Consulted and Agree with Results and Recommendations Patient;Family member/caregiver;RN  SLP Evaluations  $ SLP Speech Visit 1 Procedure  SLP Evaluations  $BSS Swallow 1 Procedure  $Swallowing Treatment 1 Procedure

## 2013-05-14 NOTE — Progress Notes (Signed)
Utilization review completed.  

## 2013-05-15 ENCOUNTER — Encounter (HOSPITAL_COMMUNITY): Payer: Self-pay

## 2013-05-15 ENCOUNTER — Encounter (HOSPITAL_COMMUNITY)
Admission: EM | Disposition: A | Discharge status: Home / Self Care | Payer: Self-pay | Source: Home / Self Care | Attending: Family Medicine

## 2013-05-15 DIAGNOSIS — R7309 Other abnormal glucose: Secondary | ICD-10-CM

## 2013-05-15 DIAGNOSIS — R1310 Dysphagia, unspecified: Secondary | ICD-10-CM

## 2013-05-15 HISTORY — PX: ESOPHAGOGASTRODUODENOSCOPY: SHX5428

## 2013-05-15 HISTORY — PX: SAVORY DILATION: SHX5439

## 2013-05-15 LAB — BASIC METABOLIC PANEL
BUN: 9 mg/dL (ref 6–23)
BUN: 8 mg/dL (ref 6–23)
CO2: 26 mEq/L (ref 19–32)
Calcium: 9 mg/dL (ref 8.4–10.5)
Calcium: 8.6 mg/dL (ref 8.4–10.5)
Chloride: 105 mEq/L (ref 96–112)
Creatinine, Ser: 1.13 mg/dL — ABNORMAL HIGH (ref 0.50–1.10)
Creatinine, Ser: 1.09 mg/dL (ref 0.50–1.10)
Creatinine, Ser: 1.06 mg/dL (ref 0.50–1.10)
GFR calc Af Amer: 51 mL/min — ABNORMAL LOW (ref >90)
GFR calc Af Amer: 53 mL/min — ABNORMAL LOW (ref >90)
GFR calc Af Amer: 55 mL/min — ABNORMAL LOW (ref >90)
GFR calc non Af Amer: 44 mL/min — ABNORMAL LOW (ref >90)
GFR calc non Af Amer: 46 mL/min — ABNORMAL LOW (ref >90)
Glucose, Bld: 164 mg/dL — ABNORMAL HIGH (ref 70–99)
Potassium: 2.6 mEq/L — CL (ref 3.5–5.1)
Sodium: 142 mEq/L (ref 135–145)

## 2013-05-15 LAB — CBC
MCV: 89.6 fL (ref 78.0–100.0)
Platelets: 212 10*3/uL (ref 150–400)
RBC: 3.28 MIL/uL — ABNORMAL LOW (ref 3.87–5.11)
RDW: 18.4 % — ABNORMAL HIGH (ref 11.5–15.5)
WBC: 6.1 10*3/uL (ref 4.0–10.5)

## 2013-05-15 LAB — GLUCOSE, CAPILLARY: Glucose-Capillary: 108 mg/dL — ABNORMAL HIGH (ref 70–99)

## 2013-05-15 LAB — URINE CULTURE

## 2013-05-15 SURGERY — EGD (ESOPHAGOGASTRODUODENOSCOPY)
Anesthesia: Moderate Sedation

## 2013-05-15 MED ORDER — FENTANYL CITRATE 0.05 MG/ML IJ SOLN
INTRAMUSCULAR | Status: AC
  Filled 2013-05-15: qty 2

## 2013-05-15 MED ORDER — POTASSIUM CHLORIDE CRYS ER 20 MEQ PO TBCR
20.0000 meq | EXTENDED_RELEASE_TABLET | Freq: Once | ORAL | Status: AC
  Administered 2013-05-15: 20 meq via ORAL
  Filled 2013-05-15: qty 1

## 2013-05-15 MED ORDER — POTASSIUM CHLORIDE 10 MEQ/100ML IV SOLN
10.0000 meq | INTRAVENOUS | Status: AC
  Administered 2013-05-15 (×3): 10 meq via INTRAVENOUS
  Filled 2013-05-15 (×3): qty 100

## 2013-05-15 MED ORDER — MIDAZOLAM HCL 10 MG/2ML IJ SOLN
INTRAMUSCULAR | Status: DC | PRN
  Administered 2013-05-15: 1 mg via INTRAVENOUS
  Administered 2013-05-15: 2 mg via INTRAVENOUS
  Administered 2013-05-15: 1 mg via INTRAVENOUS

## 2013-05-15 MED ORDER — POTASSIUM CHLORIDE 10 MEQ PO TBCR
20.0000 meq | EXTENDED_RELEASE_TABLET | Freq: Once | ORAL | Status: DC
  Filled 2013-05-15 (×2): qty 2

## 2013-05-15 MED ORDER — SENNOSIDES-DOCUSATE SODIUM 8.6-50 MG PO TABS
1.0000 | ORAL_TABLET | Freq: Every day | ORAL | Status: DC | PRN
  Filled 2013-05-15: qty 1

## 2013-05-15 MED ORDER — FENTANYL CITRATE 0.05 MG/ML IJ SOLN
INTRAMUSCULAR | Status: DC | PRN
  Administered 2013-05-15 (×2): 25 ug via INTRAVENOUS

## 2013-05-15 MED ORDER — MIDAZOLAM HCL 5 MG/ML IJ SOLN
INTRAMUSCULAR | Status: AC
  Filled 2013-05-15: qty 2

## 2013-05-15 MED ORDER — POTASSIUM CHLORIDE CRYS ER 20 MEQ PO TBCR
EXTENDED_RELEASE_TABLET | ORAL | Status: AC
  Administered 2013-05-15: 20 meq via ORAL
  Filled 2013-05-15: qty 1

## 2013-05-15 MED ORDER — POTASSIUM CHLORIDE 20 MEQ PO PACK
20.0000 meq | PACK | Freq: Once | ORAL | Status: DC
  Filled 2013-05-15: qty 1

## 2013-05-15 MED ORDER — BUTAMBEN-TETRACAINE-BENZOCAINE 2-2-14 % EX AERO
INHALATION_SPRAY | CUTANEOUS | Status: DC | PRN
  Administered 2013-05-15: 2 via TOPICAL

## 2013-05-15 MED ORDER — POTASSIUM CHLORIDE CRYS ER 20 MEQ PO TBCR: 20.0000 meq | EXTENDED_RELEASE_TABLET | Freq: Once | ORAL | Status: AC

## 2013-05-15 MED ORDER — POTASSIUM CHLORIDE 10 MEQ/100ML IV SOLN
10.0000 meq | Freq: Once | INTRAVENOUS | Status: AC
  Administered 2013-05-15: 10 meq via INTRAVENOUS
  Filled 2013-05-15: qty 100

## 2013-05-15 NOTE — Op Note (Signed)
[facility] [address] [place], [address]   ENDOSCOPY PROCEDURE REPORT  PATIENT: Shannon Camacho, Shannon Camacho  MR#: [id number] BIRTHDATE: 1928-06-26 , 85  yrs. old GENDER: Female  ENDOSCOPIST: Charlott Rakes, MD REFERRED QM:VHQIONGE team  PROCEDURE DATE:  05/15/2013 PROCEDURE:   EGD with balloon dilation ASA CLASS:   Class III INDICATIONS:dysphagia; esophageal stricture. MEDICATIONS: Fentanyl 25 mcg IV, Versed 4 mg IV, and Cetacaine spray x 2  TOPICAL ANESTHETIC:  DESCRIPTION OF PROCEDURE:   After the risks benefits and alternatives of the procedure were thoroughly explained, informed consent was obtained.  The Pentax Gastroscope [id number]  endoscope was introduced through the mouth and advanced to the second portion of the duodenum , limited by Without limitations.   The instrument was slowly withdrawn as the mucosa was fully examined.     FINDINGS: The endoscope was inserted into the oropharynx and esophagus was intubated. A mildly obstructing benign-appearing distal esophageal stricture was noted and the endoscope was able to be advanced into the stomach without difficulty. The esophagus was otherwise normal in appearance.  The gastroesophageal junction was noted to be 36 cm from the incisors.  Endoscope was advanced into the stomach, which revealed a 4 mm clean-based antral ulcer with surrounding edema. The endoscope was advanced to the duodenal bulb and second portion of duodenum which were unremarkable.  The endoscope was withdrawn back into the stomach and retroflexion revealed a normal proximal stomach. A TTS balloon was inserted and insufflated to 15 mm without any evidence of successful dilation. Unsuccessful dilation at 16.5 mm (after each of these lower sizes being held for 1 minute). At 18 mm size with it being held for 30 seconds there was evidence of successful dilation of the stricture with expected blood  post-dilation.  COMPLICATIONS: None  ENDOSCOPIC IMPRESSION:     1. Benign-appearing distal esophageal stricture - s/p balloon dilation to 18 mm 2. Small antral ulcer 3. Small hiatal hernia  RECOMMENDATIONS: Clear liquids; PPI 40mg  IV Q12 hours; Consider advancing diet tomorrow if tolerates liquids   REPEAT EXAM: N/A  _______________________________ Charlott Rakes, MD eSigned:  Charlott Rakes, MD 05/15/2013 12:19 PM    CC:  PATIENT NAME:  Shannon Camacho, Shannon Camacho MR#: [id number]

## 2013-05-15 NOTE — Progress Notes (Signed)
FMTS Attending Daily Note: Sara Neal MD [phone] pager office [phone] I  have seen and examined this patient, reviewed their chart. I have discussed this patient with the resident. I agree with the resident's findings, assessment and care plan. 

## 2013-05-15 NOTE — Interval H&P Note (Signed)
History and Physical Interval Note:  05/15/2013 11:24 AM  Burna Forts  has presented today for surgery, with the diagnosis of dys  The various methods of treatment have been discussed with the patient and family. After consideration of risks, benefits and other options for treatment, the patient has consented to  Procedure(s) with comments: ESOPHAGOGASTRODUODENOSCOPY (EGD) (N/A) - no c- arm per dr. Bosie Clos SAVORY DILATION (N/A) as a surgical intervention .  The patient's history has been reviewed, patient examined, no change in status, stable for surgery.  I have reviewed the patient's chart and labs.  Questions were answered to the patient's satisfaction.     Taequan Stockhausen C.

## 2013-05-15 NOTE — Progress Notes (Signed)
Family Medicine Teaching Service Daily Progress Note Intern Pager: [phone]  Patient name: Shannon Camacho Medical record number: [id number] Date of birth: 1928/05/31 Age: 79 y.o. Gender: female  Primary Care Provider: Sanjuana Letters, MD (Primary Oncologist Dr. Arbutus Ped) Consultants - GI (Dr. Bosie Clos)  Pt Overview and Major Events to Date: 79 y.o. year old female presenting with 8 hour onset of chest discomfort consistent with esophageal dysfunction. Recent evaluation for dysuria and frequency found to have a Proteus UTI that has been untreated. Vital signs and labs reassuring at admission.   6/18 - admit for ACS eval and UTI; treated with Rocephin, PPI/H2-blocker, glucagon for ?esophageal spasm 6/19 - GI consulted for concern for esophageal dysmotility; barium swallow showed spasm and distal stricture 6/20 - EGD with dilation by Dr. Bosie Clos  Assessment and Plan: Doing well this morning prior to EGD, eager to try eating. Otherwise afebrile with stable vital signs and generally unchanged exam.  #Chest pain evaluation  - acute onset 7/19, lasting ~30 minutes; not likely cardiac -on ASA with hx of PCI, with 2 neg troponins x2 and no EKG changes in the ED; serum troponin negative as well -risk stratification labs  -TSH 2.994 (wnl)  -Hb A1c 5.7 (no indication for medication)  #Dysphagia - likely direct contributor to/cause of chest pain; pain and regurgitation especially with liquids -s/p glucacon x1 6/19 for suspected esophageal spasm -SLP recommended GI consult, appreciated recommendations -continue PPI (Protonix 40 mg BID), continue Pepcid daily [ ]  trial of clear liquids with possible advance tomorrow [ ]  f/u further GI recommendations [ ]  may consider CCB for esophageal spasm (?diltiazem)  #Hypokalemia - noted 6/20, 2.7 -repleting IV [ ]  f/u BMP this afternoon  #UTI - Proteus seen on culture from clinic visit 6/11; untreated (planned tx with Keflex 10 mg for 10  days) -continue Rocephin 1g q24 (6/20 is day 3 of planned 3 of Rocephin) [ ]  f/u repeat culture (shows Proteus, sens pending)  #HTN, CAD - continuing home medications (ASA, HCTZ, metoprolol)  #CKD, stage II - admission Cr 1.3 (baseline ~1.2) -continue IVF especially while NPO [ ]  monitor Cr trend  #Hx of adenocarcinoma of right lung, stage IV - previously followed at [facility]; also followed by Dr. Arbutus Ped -last tx 04/21/2013, now finished, with surveillance only -intrathoracic disease could be contributing to dysphagia -will consider oncology consult if needed  #Anemia - chronic; pt required transfusions frequency while on chemotherapy for the aboev -Hb on admission 10.5 [ ]  trend Hb  #HLD - Continue Zocor (formulary substitute for Pravachol) -would benefit from high-potency statin given hx of MI; plan to switch to Crestor at D/C (pt does not want Lipitor)  #Hypothyroidism - TSH as above; continue Synthroid 88 mcg daily  #Constipation - ordered for Senna PRN; instructed pt not to take home medicine while admitted, informed RN  FEN/GI: clear liquid diet, D5-NS at 100 mL/h PPx: SubQ heparain, PPI, H2-blocker as above Code Status: Full code, clarified 6/20  Disposition: Management as above. Discharge pending further GI work-up/therapies.  Subjective: Pt seen at bedside. States she feels well this morning. Had a BM last night after taking a home herbal laxative (label identifies sennosides as active ingredient). Eager to have her scope so "hopefully she can eat."  Objective: Temp:  [98.4 F (36.9 C)-99.3 F (37.4 C)] 98.4 F (36.9 C) (06/20 1202) Pulse Rate:  [83-86] 83 (06/20 0553) Resp:  [11-33] 21 (06/20 1230) BP: (106-226)/(42-196) 149/66 mmHg (06/20 1230) SpO2:  [90 %-98 %] 95 % (06/20  1230) Exam: General: elderly adult female, obese, in NAD Cardiovascular: RRR, soft blowing systolic murmur (not new per pt; has had it in the past, then it "went away") Respiratory: CTAB, no  wheezes, normal WOB Abdomen: BS+, soft, nontender Extremities: moves all extremities equally; warm/well-perfused, trace edema bilaterally in LE (at baseline per pt)  Laboratory:  Recent Labs Lab 05/11/13 0819 05/13/13 0825 05/15/13 0507  WBC 5.0 9.8 6.1  HGB 11.5* 10.5* 9.3*  HCT 36.2 33.0* 29.4*  PLT 294 254 212    Recent Labs Lab 05/11/13 0818 05/13/13 0825 05/15/13 0507  NA 141 139 142  K 3.5 3.3* 2.7*  CL 104 103 107  CO2 27 26 24   BUN 12.1 15 11   CREATININE 1.2* 1.31* 1.13*  CALCIUM 9.2 9.1 9.0  PROT 8.3 8.1  --   BILITOT 0.71 1.0  --   ALKPHOS 79 84  --   ALT 23 22  --   AST 36* 48*  --   GLUCOSE 126* 111* 106*    Recent Labs Lab 05/13/13 1630  TROPONINI <0.30  Negative POC troponin 6/18 x2  Urinalysis 6/18  05/13/2013 09:59  Color, Urine BROWN (A)  APPearance CLOUDY (A)  Specific Gravity, Urine 1.022  pH 6.0  Glucose NEGATIVE  Bilirubin Urine LARGE (A)  Ketones, ur 15 (A)  Protein >300 (A)  Urobilinogen, UA 1.0  Nitrite POSITIVE (A)  Leukocytes, UA MODERATE (A)  Hgb urine dipstick LARGE (A)  WBC, UA 11-20  RBC / HPF 0-2  Squamous Epithelial / LPF RARE  Bacteria, UA MANY (A)  Casts GRANULAR CAST (A)   Microbiology:  Urine culture 6/11 (clinic) - Proteus (pan-sensitive except to nitrofurantoin)  Urine culture 6/18 - >100k CFU/mL Proteus, sensitivities pending  Imaging/Diagnostic Tests: EKG 6/19 - baseline wander but no acute change from previous; NSR, no frank ischemia CXR 6/18 - no acute change in right-sided masses (known/treated lung cancer); no other consolidations Barium swallow 6/19 - diffuse esophageal spasm; tight, smooth esophageal stricture just above GE junction EGD 6/20 - tight esophageal stricture dilated with balloon at 18 mm (unsucessful at smaller sizes)  -small non-bleeding antral ulcer, small hiatal hernia  Bobbye Morton, MD 05/15/2013, 12:54 PM PGY-1, [facility] Family Medicine FPTS Intern pager: [phone]

## 2013-05-15 NOTE — H&P (View-Only) (Signed)
Patient ID: Shannon Camacho, female   DOB: 31-Aug-1928, 79 y.o.   MRN: [id number]  Barium swallow results noted. Will plan to do EGD with possible dilation tomorrow (time to be determined).

## 2013-05-15 NOTE — Brief Op Note (Addendum)
See endopro for details/recs. S/P successful dilation of benign-appearing stricture. Dysmotility also contributing to dysphagia.

## 2013-05-15 NOTE — Progress Notes (Signed)
CRITICAL VALUE ALERT  Critical value received: k 2.7   Date of notification:  05/15/13  Time of notification:  0650  Critical value read back:yes  Nurse who received alert:  Jodene Nam RN  MD notified (1st page):  MD on-call  Time of first page: 0700

## 2013-05-15 NOTE — H&P (Signed)
FMTS Attending Admission Note: Sara Neal MD [phone] pager office [phone] I  have seen and examined this patient, reviewed their chart. I have discussed this patient with the resident. I agree with the resident's findings, assessment and care plan. 

## 2013-05-16 LAB — BASIC METABOLIC PANEL
BUN: 6 mg/dL (ref 6–23)
CO2: 25 mEq/L (ref 19–32)
Calcium: 8.4 mg/dL (ref 8.4–10.5)
Chloride: 107 mEq/L (ref 96–112)
Creatinine, Ser: 1.1 mg/dL (ref 0.50–1.10)

## 2013-05-16 LAB — CBC
HCT: 26.8 % — ABNORMAL LOW (ref 36.0–46.0)
MCHC: 31.7 g/dL (ref 30.0–36.0)
MCV: 90.5 fL (ref 78.0–100.0)
Platelets: 189 10*3/uL (ref 150–400)
RDW: 18.4 % — ABNORMAL HIGH (ref 11.5–15.5)

## 2013-05-16 MED ORDER — MAGNESIUM SULFATE 40 MG/ML IJ SOLN
2.0000 g | Freq: Once | INTRAMUSCULAR | Status: AC
  Administered 2013-05-16: 2 g via INTRAVENOUS
  Filled 2013-05-16: qty 50

## 2013-05-16 MED ORDER — POTASSIUM CHLORIDE ER 20 MEQ PO TBCR: 20.0000 meq | EXTENDED_RELEASE_TABLET | Freq: Every day | ORAL | Status: DC

## 2013-05-16 MED ORDER — POTASSIUM CHLORIDE 20 MEQ/15ML (10%) PO LIQD
40.0000 meq | Freq: Every day | ORAL | Status: DC
  Administered 2013-05-16: 40 meq via ORAL
  Filled 2013-05-16 (×2): qty 30

## 2013-05-16 NOTE — Discharge Summary (Signed)
Family Medicine Teaching Service Admission H&P Service Pager: [phone]  Shannon Camacho 79 y.o. female  MRN: [id number]  DOB: [birth year]   Primary Care Provider:   Sanjuana Letters, MD Primary Oncologist - Dr. Arbutus Ped  Consultants:  GI - Dr. Bosie Clos   CODE STATUS: FULL   BRIEF PATIENT OVERVIEW   LOS: 3 days  79 y.o. year old female presenting with 8 hour onset of chest discomfort consistent with esophageal dysfunction. Recent evaluation for dysuria and frequency found to have a Proteus UTI that has been untreated. Vital signs and labs reassuring at admission. 6/18 - admit for ACS eval and UTI; treated with Rocephin, PPI/H2-blocker, glucagon for ?esophageal spasm  6/19 - GI consulted for concern for esophageal dysmotility; barium swallow showed spasm and distal stricture  6/20 - EGD with dilation by Dr. Bosie Clos  Hospitalization Information   Attending: :   Dr. Denny Levy - FMTS Attending  Dates of Hospitalization:  05/13/2013 to 05/16/13   Admission Diagnoses  Chest Pain  Discharge Diagnoses   Active Hospital Problems   Diagnosis Date Noted  . Dysphagia, unspecified(787.20) 05/14/2013  . Acute UTI 05/14/2013  . Chest pain, unspecified 05/14/2013  . Anemia 03/12/2013  . Coronary artery disease 12/03/2012  . ADENOCARCINOMA, LUNG 05/20/2009  . HYPERLIPIDEMIA 04/26/2009  . HYPOTHYROIDISM 04/26/2009  . OSTEOARTHRITIS 04/26/2009  . HYPERTENSION, BENIGN 10/01/2007    Resolved Hospital Problems   Diagnosis Date Noted Date Resolved  No resolved problems to display.   Patient Active Problem List   Diagnosis Date Noted  . Acute UTI 05/14/2013  . Dysphagia, unspecified(787.20) 05/14/2013  . Chest pain, unspecified 05/14/2013  . Urge incontinence of urine 05/06/2013  . Presbycusis 04/10/2013  . Anemia 03/12/2013  . Coronary artery disease 12/03/2012  . Hyperglycemia 12/03/2012  . Insomnia 12/03/2012  . ADENOCARCINOMA, LUNG 05/20/2009  . HYPOTHYROIDISM 04/26/2009  .  HYPERLIPIDEMIA 04/26/2009  . OSTEOARTHRITIS 04/26/2009  . OBESITY 10/01/2007  . HYPERTENSION, BENIGN 10/01/2007    Discharge Medications     Medication List    STOP taking these medications       aspirin 81 MG tablet      TAKE these medications       aspirin 81 MG chewable tablet  Chew 162 mg by mouth once.     cholecalciferol 400 UNITS Tabs  Commonly known as:  VITAMIN D  Take 400 Units by mouth daily.     ciclopirox 0.77 % cream  Commonly known as:  LOPROX  Apply 1 application topically daily as needed (for rash on feet). Uses for feet.     diclofenac sodium 1 % Gel  Commonly known as:  VOLTAREN  Apply 4 g topically 4 (four) times daily as needed (for knee pain). Uses on knees     folic acid 1 MG tablet  Commonly known as:  FOLVITE  Take 1 tablet (1 mg total) by mouth daily.     hydrochlorothiazide 12.5 MG capsule  Commonly known as:  MICROZIDE  Take 1 capsule (12.5 mg total) by mouth daily.     levothyroxine 88 MCG tablet  Commonly known as:  SYNTHROID, LEVOTHROID  Take 1 tablet (88 mcg total) by mouth daily.     metoprolol tartrate 25 MG tablet  Commonly known as:  LOPRESSOR  Take 1 tablet (25 mg total) by mouth 2 (two) times daily.     Potassium Chloride ER 20 MEQ Tbcr  Take 20 mEq by mouth daily.     pravastatin 40 MG tablet  Commonly  known as:  PRAVACHOL  Take 1.5 tablets (60 mg total) by mouth daily.     traMADol 50 MG tablet  Commonly known as:  ULTRAM  Take 50 mg by mouth every 6 (six) hours as needed for pain.     traZODone 50 MG tablet  Commonly known as:  DESYREL  Take 25-50 mg by mouth at bedtime as needed for sleep.        Discharge Information  Disposition: D/c to home Discharge Diet: Resume diet Discharge Condition:  Improved Discharge Activity: Ad lib  Pending Results: none  Discharge Orders   Future Appointments Provider Department Dept Phone   05/20/2013 3:45 PM Sanjuana Letters, MD [facility]  [phone]   08/10/2013 10:00 AM Krista Blue [facility] [phone]   08/10/2013 10:30 AM [facility] [facility] [phone]   Patient to arrive 15 minutes prior to appointment time. Patient to pick up oral contrast at least 1 day prior to exam, unless otherwise instructed by your physician. No solid food 4 hours prior to exam. Liquids and Medicines are okay.   08/12/2013 8:30 AM Si Gaul, MD [facility] [phone]   Future Orders Complete By Expires     Diet - low sodium heart healthy  As directed     Increase activity slowly  As directed       Follow-up Information   Schedule an appointment as soon as possible for a visit with Sanjuana Letters, MD. (For early next week)    Contact information:   [address] [place] [address] [phone]       Objective Findings on day of Discharge  Vitals:    Weight: Baseline Weight: 216  Filed Weights   05/13/13 0820 05/13/13 1419  Weight: 218 lb 4 oz (98.998 kg) 216 lb 1.6 oz (98.022 kg)   PE: GENERAL: Adult obese AA female. In no discomfort; no respiratory distress.  PSYCH: Alert and appropriately interactive; Insight:Good  H&N: AT/[place], trachea midline, no obvious thyromegaly  EENT: MMM, no scleral icterus, EOMi  HEART: RRR, S1/S2 heard, no murmur  LUNGS: no wheezes, scattered crackles, no focal consolidation  ABDOMEN: +BS, soft, non-tender, no rigidity, no guarding, no masses/organomegaly  EXTREMITIES: Moves all 4 extremities spontaneously, warm well perfused, no edema, bilateral DP and PT pulses 2/4.   LABS: Daily Others:   Recent Labs Lab 05/13/13 0825 05/15/13 0507 05/16/13 0615  WBC 9.8 6.1 4.5  HGB 10.5* 9.3* 8.5*  HCT 33.0* 29.4* 26.8*  PLT 254 212 189    Recent Labs Lab 05/15/13 1357 05/15/13 1904 05/16/13 0615  NA 141 138 139  K 2.6* 3.0* 3.0*  CL 107 105 107  CO2 25 26 25   BUN 9 8 6    CREATININE 1.09 1.06 1.10  GLUCOSE 164* 122* 120*  CALCIUM 8.7 8.6 8.4    Recent Labs Lab 05/13/13 0829 05/13/13 1046 05/13/13 1345 05/13/13 1630  TROPIPOC 0.01 0.02 0.02  --   TROPONINI  --   --   --  <0.30      05/13/2013 16:30  Hemoglobin A1C 5.7 (H)  TSH 2.994     URINE STUDIES:   05/13/2013 09:59   Color, Urine  BROWN (A)   APPearance  CLOUDY (A)   Specific Gravity, Urine  1.022   pH  6.0   Glucose  NEGATIVE   Bilirubin Urine  LARGE (A)   Ketones, ur  15 (  A)   Protein  >300 (A)   Urobilinogen, UA  1.0   Nitrite  POSITIVE (A)   Leukocytes, UA  MODERATE (A)   Hgb urine dipstick  LARGE (A)   WBC, UA  11-20   RBC / HPF  0-2   Squamous Epithelial / LPF  RARE   Bacteria, UA  MANY (A)   Casts  GRANULAR CAST (A)   MICRO:  6/11 - Urine Cx >>> Proteus - pan sensitive except Nitrofurantoin - FINAL  6/18 - Urine Cx >>> Proteus - pan sensitive except Nitrofurantoin - FINAL IMAGING:  6/18 - CXR - R masses unchanged, no acute  02/08/12 - 2D ECHO - LVEF 68% with mild diastolic dysfunction, mild aortic and mitral regurg PROCEDURES: 6/20 - EGD with esophageal balloon dilation  Brief Hospital Course by Problem/System  LOS: 3  * Chest Pain - Acute episode 6/19, lasting ~30 minutes that re-occurred during admission.  CEs negative.  Found to have associated dysphagia.  ACS evaluation negative  - Risk stratification labs un-remarkable and as above  * Dysphagia - Pt found to have esophageal stricture and underwent EGD with dilation with Dr. Bosie Clos.  Significant improvement in her swallowing symptoms and chest pain.  Resumed regular diet.   - Initially started on IV PPI and H2.  Trial of glucagon did not reduce chest discomfort.   - per GI recommendations needs to stay on PPI QD [ ]  f/u swallowing function and pain  * UTI - Untreated Proteus UTI at time of admission.  Treated with IV Rocephin 1gm q 24 during hospitalization.  No sx at time of discharge. [ ]  consider TOC if pt  is having any residual sx  * Hx of Adenocarcinoma of R Lung, Stage IV - Has been followed most recently by Dr. Arbutus Ped.  Finished last treatment of CHEMO on 5/27.  Otherwise stable.  Most recent CT is stable.   - If GI symptoms re-occur consider earlier return to onc vs earlier imaging to asses for extrinsic compression of esophagus although this is not favored given findings on EGD.    * HypoKalemia, Hypomagnesemia, CKD stage II.  Cr slightly above baseline (1.2) at 1.3 at time of admission.  Cr improved to 1.1 at time of discharge.  Magnesium was checked and was 1.4.  Repleated with IV mag on day of discharge.  K supplementation at d/c [ ]  recheck BMET, consider Magnesium check  * HTN, CAD - home meds continued, no changes  * HLD - was on Pravachol.  Given known prior CAD would benefit from high potency statin.  Pt does not want Liptor but agreeable to CRESTOR. [ ]  START CRESTOR for patient at follow up.  * Anemia - Chronic, has been requiring transfusions while on chemo.  Hb stable during hospitalizaiton.  * Hypothyroidism - TSH therapeutic.  Continued on Synthroid q day  * Chronic Constipation -  Decreased BM while hospitalized.  Added Senna.  f/u at d/c but long standing.       Andrena Mews, DO [facility] Resident - PGY-2 05/19/2013 9:24 PM

## 2013-05-16 NOTE — Progress Notes (Signed)
Patient ID: Shannon Camacho, female   DOB: [birth year], 79 y.o.   MRN: [id number]   Sitting up on side of bed. Wants to eat. Does not want liquids. Feels ok. Nurse in room.   Will give soft foods and if tolerates advance to heart healthy diet. Needs to take PPI PO QD at home. Ok to d/c from GI standpoint if tolerates POs.

## 2013-05-18 ENCOUNTER — Other Ambulatory Visit: Payer: PRIVATE HEALTH INSURANCE

## 2013-05-19 ENCOUNTER — Encounter (HOSPITAL_COMMUNITY): Payer: Self-pay | Admitting: Gastroenterology

## 2013-05-20 ENCOUNTER — Ambulatory Visit (INDEPENDENT_AMBULATORY_CARE_PROVIDER_SITE_OTHER): Payer: PRIVATE HEALTH INSURANCE | Admitting: Family Medicine

## 2013-05-20 ENCOUNTER — Encounter: Payer: Self-pay | Admitting: Family Medicine

## 2013-05-20 VITALS — BP 104/46 | HR 80 | Temp 98.7°F | Ht 62.0 in | Wt 216.0 lb

## 2013-05-20 DIAGNOSIS — R7309 Other abnormal glucose: Secondary | ICD-10-CM

## 2013-05-20 DIAGNOSIS — K222 Esophageal obstruction: Secondary | ICD-10-CM

## 2013-05-20 DIAGNOSIS — I10 Essential (primary) hypertension: Secondary | ICD-10-CM

## 2013-05-20 DIAGNOSIS — R1011 Right upper quadrant pain: Secondary | ICD-10-CM

## 2013-05-20 DIAGNOSIS — G8929 Other chronic pain: Secondary | ICD-10-CM

## 2013-05-20 DIAGNOSIS — R739 Hyperglycemia, unspecified: Secondary | ICD-10-CM

## 2013-05-20 MED ORDER — OMEPRAZOLE 40 MG PO CPDR: 40.0000 mg | DELAYED_RELEASE_CAPSULE | Freq: Every day | ORAL | Status: DC

## 2013-05-20 NOTE — Patient Instructions (Addendum)
Stop your hydrochlorothiazide  Stop all potassium pills I will call with the blood work results.  I am checking your liver, gall bladder and diabetes. Start taking omeprazole to decrease your chance of having the esophageal stricture return See me in one month to make sure we are moving in the right direction.  Please bring her health care directives next visit.

## 2013-05-21 ENCOUNTER — Encounter: Payer: Self-pay | Admitting: Family Medicine

## 2013-05-21 DIAGNOSIS — R1011 Right upper quadrant pain: Secondary | ICD-10-CM | POA: Insufficient documentation

## 2013-05-21 LAB — COMPLETE METABOLIC PANEL WITH GFR
Albumin: 3.6 g/dL (ref 3.5–5.2)
CO2: 28 mEq/L (ref 19–32)
GFR, Est African American: 40 mL/min — ABNORMAL LOW
GFR, Est Non African American: 35 mL/min — ABNORMAL LOW
Glucose, Bld: 98 mg/dL (ref 70–99)
Potassium: 3.6 mEq/L (ref 3.5–5.3)
Sodium: 140 mEq/L (ref 135–145)
Total Bilirubin: 0.5 mg/dL (ref 0.3–1.2)
Total Protein: 8 g/dL (ref 6.0–8.3)

## 2013-05-21 NOTE — Assessment & Plan Note (Signed)
Add PPI

## 2013-05-21 NOTE — Progress Notes (Signed)
  Subjective:    Patient ID: Shannon Camacho, female    DOB: 06-Apr-1928, 79 y.o.   MRN: [id number]  HPI  A lot has happened since I last saw this patient.  She has been receiving chemo, her cancer is not growing and a decision has been made to stop chemo at this point due to weakness.   She was hospitalized with dysphagia that turned out to be an esophageal stricture which was dilated.  Swallowing has definitely improved and now eating OK.    Seems to be feeling better now and weakness is slowly improving. Recent labs from hospitalization reviewed. She also has some Rt UQ abd pain     Review of Systems     Objective:   Physical Exam VS noted, low BP and modest wt loss Lungs clear Cardiac RRR without m Some mild RUQ tenderness Ext 2+ edema       Assessment & Plan:

## 2013-05-21 NOTE — Assessment & Plan Note (Signed)
Check LFTs 

## 2013-05-21 NOTE — Assessment & Plan Note (Signed)
Perhaps overtreated and may be contributing to generalized weakness

## 2013-05-21 NOTE — Assessment & Plan Note (Signed)
Check A1C 

## 2013-05-25 ENCOUNTER — Other Ambulatory Visit: Payer: PRIVATE HEALTH INSURANCE

## 2013-05-25 NOTE — Discharge Summary (Signed)
FMTS Attending Admission Note: Shannon Scripter MD [phone] pager office [phone] I  have seen and examined this patient, reviewed their chart. I have discussed this patient with the resident. I agree with the resident's findings, assessment and care plan. 

## 2013-06-03 ENCOUNTER — Telehealth: Payer: Self-pay | Admitting: Family Medicine

## 2013-06-03 NOTE — Telephone Encounter (Signed)
pts daughter states pt has been itching, swelling in feet and hands and trembling in hands since she started taking prilosec. Please advise

## 2013-06-03 NOTE — Telephone Encounter (Signed)
Left message to stop medication and to schedule an office visit with Dr Leveda Anna on daughters mobile phone. Teal Bontrager, Virgel Bouquet

## 2013-06-08 ENCOUNTER — Other Ambulatory Visit: Payer: PRIVATE HEALTH INSURANCE

## 2013-06-15 ENCOUNTER — Other Ambulatory Visit: Payer: PRIVATE HEALTH INSURANCE

## 2013-06-15 ENCOUNTER — Other Ambulatory Visit: Payer: Self-pay | Admitting: *Deleted

## 2013-06-15 DIAGNOSIS — C3490 Malignant neoplasm of unspecified part of unspecified bronchus or lung: Secondary | ICD-10-CM

## 2013-06-15 MED ORDER — FOLIC ACID 1 MG PO TABS: 1.0000 mg | ORAL_TABLET | Freq: Every day | ORAL | Status: DC

## 2013-06-22 ENCOUNTER — Other Ambulatory Visit: Payer: PRIVATE HEALTH INSURANCE

## 2013-06-22 ENCOUNTER — Other Ambulatory Visit: Payer: PRIVATE HEALTH INSURANCE | Admitting: Lab

## 2013-06-24 ENCOUNTER — Ambulatory Visit: Payer: PRIVATE HEALTH INSURANCE | Admitting: Family Medicine

## 2013-06-25 ENCOUNTER — Telehealth: Payer: Self-pay | Admitting: Family Medicine

## 2013-06-25 MED ORDER — CICLOPIROX OLAMINE 0.77 % EX CREA: 1.0000 "application " | TOPICAL_CREAM | Freq: Every day | CUTANEOUS | Status: AC | PRN

## 2013-06-25 NOTE — Telephone Encounter (Signed)
Will fwd request to MD.   Shawanda Sievert L, CMA  

## 2013-06-25 NOTE — Telephone Encounter (Signed)
Pt is requesting a refill be sent to her pharmacy on file for Ciclopirox. JW

## 2013-06-25 NOTE — Telephone Encounter (Signed)
Refill sent.

## 2013-07-08 ENCOUNTER — Ambulatory Visit (INDEPENDENT_AMBULATORY_CARE_PROVIDER_SITE_OTHER): Payer: PRIVATE HEALTH INSURANCE | Admitting: Family Medicine

## 2013-07-08 ENCOUNTER — Encounter: Payer: Self-pay | Admitting: Family Medicine

## 2013-07-08 VITALS — BP 130/80 | HR 65 | Temp 97.9°F | Ht 62.0 in | Wt 219.7 lb

## 2013-07-08 DIAGNOSIS — H9110 Presbycusis, unspecified ear: Secondary | ICD-10-CM

## 2013-07-08 DIAGNOSIS — M1990 Unspecified osteoarthritis, unspecified site: Secondary | ICD-10-CM

## 2013-07-08 DIAGNOSIS — G8929 Other chronic pain: Secondary | ICD-10-CM

## 2013-07-08 DIAGNOSIS — I10 Essential (primary) hypertension: Secondary | ICD-10-CM

## 2013-07-08 DIAGNOSIS — H9113 Presbycusis, bilateral: Secondary | ICD-10-CM

## 2013-07-08 DIAGNOSIS — R1011 Right upper quadrant pain: Secondary | ICD-10-CM

## 2013-07-08 MED ORDER — TRAMADOL HCL 50 MG PO TABS: 50.0000 mg | ORAL_TABLET | Freq: Four times a day (QID) | ORAL | Status: DC | PRN

## 2013-07-08 MED ORDER — FUROSEMIDE 20 MG PO TABS: 20.0000 mg | ORAL_TABLET | Freq: Every day | ORAL | Status: DC

## 2013-07-08 NOTE — Patient Instructions (Addendum)
I will call with the blood work. I sent in a new prescription for a mild fluid pill - furosemide We will give you information about hearing test referral I will give you a prescription for the tramadol.

## 2013-07-09 LAB — COMPLETE METABOLIC PANEL WITH GFR
AST: 25 U/L (ref 0–37)
Alkaline Phosphatase: 80 U/L (ref 39–117)
BUN: 20 mg/dL (ref 6–23)
Creat: 1.2 mg/dL — ABNORMAL HIGH (ref 0.50–1.10)

## 2013-07-09 NOTE — Assessment & Plan Note (Signed)
OK control, add furosemide low dose with her edema.

## 2013-07-09 NOTE — Progress Notes (Signed)
  Subjective:    Patient ID: Shannon Camacho, female    DOB: [birth year], 79 y.o.   MRN: [id number]  HPI Patient doing well.  She is not receiving any chemo at this point and feels better.   Does complain of ankle swelling, bilateral and symetric.  Follows low salt diet. Wants audiology referral for possible hearing aid.   Last LFTs were slightly elevated.      Review of Systems     Objective:   Physical Exam Lungs clear Cardiac RRR without m or g Abd benign Ext bilateral 1+ edema.        Assessment & Plan:

## 2013-07-09 NOTE — Assessment & Plan Note (Signed)
Using more tramadol to maintain mobility and functional status.

## 2013-07-09 NOTE — Assessment & Plan Note (Signed)
LFTs normal.  Further WU.

## 2013-07-15 ENCOUNTER — Telehealth: Payer: Self-pay | Admitting: Family Medicine

## 2013-07-15 ENCOUNTER — Ambulatory Visit (INDEPENDENT_AMBULATORY_CARE_PROVIDER_SITE_OTHER): Payer: PRIVATE HEALTH INSURANCE | Admitting: Sports Medicine

## 2013-07-15 ENCOUNTER — Encounter: Payer: Self-pay | Admitting: Sports Medicine

## 2013-07-15 VITALS — BP 119/103 | HR 73 | Temp 99.4°F | Ht 62.0 in | Wt 219.0 lb

## 2013-07-15 DIAGNOSIS — C3490 Malignant neoplasm of unspecified part of unspecified bronchus or lung: Secondary | ICD-10-CM

## 2013-07-15 DIAGNOSIS — I10 Essential (primary) hypertension: Secondary | ICD-10-CM

## 2013-07-15 DIAGNOSIS — M25519 Pain in unspecified shoulder: Secondary | ICD-10-CM

## 2013-07-15 DIAGNOSIS — M25511 Pain in right shoulder: Secondary | ICD-10-CM

## 2013-07-15 NOTE — Progress Notes (Signed)
  [facility]  Patient name: Shannon Camacho MRN [id number]  Date of birth: [birth year]  CC & HPI:  Shannon Camacho is a 79 y.o. female who is returning to care.   Chief Complaint  Patient presents with  . Headache    rt side - the patient reports acute onset of headache overnight that radiated from her right shoulder up to her left occipital region.  She reports the pain was severe this morning and was having difficulty moving her right upper extremity due to pain as well as the time she had.  She took to tramadol and and Aleve without significant relief. However after making the appointment, 2 hours prior to coming to the appointment her pain spontaneously improved and has completely resolved at this time.  She denies any nausea, vomiting, change in vision, unilateral weakness, dysarthria.  She denies any chest pain, shortness of breath, dyspnea, orthopnea, PND.  She does have a history of adenocarcinoma of the right upper lung.  She denies any new significant swelling in the right upper extremity, numbness, tingling, weakness or paresthesias of the right upper extremity.  . Shoulder Pain    Right, see above    ROS:  See HPI  Pertinent History Reviewed:  Medical & Surgical Hx:  Reviewed: Significant for adenocarcinoma of the right long, currently followed by oncology with serial CT scans, recently unchanged. Medications: Reviewed & Updated - see associated section Social History: Reviewed -  reports that she quit smoking about 39 years ago. She has never used smokeless tobacco.   Objective Findings:  Vitals: BP 119/103  Pulse 73  Temp(Src) 99.4 F (37.4 C) (Oral)  Ht 5\' 2"  (1.575 m)  Wt 219 lb (99.338 kg)  BMI 40.05 kg/m2  SpO2 96%  PE: GENERAL: Elderly AA  female. In no discomfort; no respiratory distress  PSYCH:  alert and appropriate, good insight      NeuroMSK Right shoulder and had Exam: Appear:  normal appearing, there is very slight edema in the distal volar  aspect of the forearm that is nonpitting.  There is no ecchymosis, there is no significant pallor or edema  Palp:  there is no tenderness to palpation over the distal arm, tenderness to palpation over the right trapezius muscle that radiates to the right shoulder and right head that is reproducible of her pain prior to presentation.  ROM:  full shoulder range of motion, cervical range of motion is restricted in left sidebending otherwise full  NV:   bilateral radial pulses 2+4, sensation is grossly intact, upper extremity myotomes 5+/5.  Testing:  right Adson's test is normal. Normal and a drop arm test, normal arch, is negative and empty can and crossarm test, negative Yergason's, negative Neers, negative Hawkins         Assessment & Plan:   1. Pain in joint, shoulder region, right   2. ADENOCARCINOMA, LUNG   3. HYPERTENSION, BENIGN     Given the patient's spontaneous resolution of her symptoms likely secondary to muscle spasm/trigger point.  There is no evidence of thoracic outlet syndrome and no evidence of vascular involvement.  Patient was given return precautions and will follow-up as needed.

## 2013-07-15 NOTE — Telephone Encounter (Signed)
Patient's daughter called about her mother. Her mother has been having acute onset of right shoulder pain radiating to the neck. It started around 3 or 4am this morning. Pain is only mildly improved but still very severe.   Recommended that patient be seen in crosscover today if symptoms are persisting.   Patient's daughter expressed understanding and agreed with plan.   Marena Chancy, PGY-3 Family Medicine Resident

## 2013-07-21 DIAGNOSIS — M25519 Pain in unspecified shoulder: Secondary | ICD-10-CM | POA: Insufficient documentation

## 2013-08-10 ENCOUNTER — Other Ambulatory Visit (HOSPITAL_BASED_OUTPATIENT_CLINIC_OR_DEPARTMENT_OTHER): Payer: PRIVATE HEALTH INSURANCE | Admitting: Lab

## 2013-08-10 ENCOUNTER — Ambulatory Visit (HOSPITAL_COMMUNITY)
Admission: RE | Admit: 2013-08-10 | Discharge: 2013-08-10 | Disposition: A | Discharge status: Home / Self Care | Payer: PRIVATE HEALTH INSURANCE | Source: Ambulatory Visit | Attending: Internal Medicine | Admitting: Internal Medicine

## 2013-08-10 ENCOUNTER — Encounter (HOSPITAL_COMMUNITY): Payer: Self-pay

## 2013-08-10 DIAGNOSIS — C3410 Malignant neoplasm of upper lobe, unspecified bronchus or lung: Secondary | ICD-10-CM

## 2013-08-10 DIAGNOSIS — R0602 Shortness of breath: Secondary | ICD-10-CM | POA: Insufficient documentation

## 2013-08-10 DIAGNOSIS — R05 Cough: Secondary | ICD-10-CM | POA: Insufficient documentation

## 2013-08-10 DIAGNOSIS — C3490 Malignant neoplasm of unspecified part of unspecified bronchus or lung: Secondary | ICD-10-CM

## 2013-08-10 DIAGNOSIS — K5900 Constipation, unspecified: Secondary | ICD-10-CM | POA: Insufficient documentation

## 2013-08-10 DIAGNOSIS — R1084 Generalized abdominal pain: Secondary | ICD-10-CM | POA: Insufficient documentation

## 2013-08-10 DIAGNOSIS — R059 Cough, unspecified: Secondary | ICD-10-CM | POA: Insufficient documentation

## 2013-08-10 DIAGNOSIS — E278 Other specified disorders of adrenal gland: Secondary | ICD-10-CM | POA: Insufficient documentation

## 2013-08-10 DIAGNOSIS — R918 Other nonspecific abnormal finding of lung field: Secondary | ICD-10-CM | POA: Insufficient documentation

## 2013-08-10 DIAGNOSIS — I2510 Atherosclerotic heart disease of native coronary artery without angina pectoris: Secondary | ICD-10-CM | POA: Insufficient documentation

## 2013-08-10 LAB — COMPREHENSIVE METABOLIC PANEL (CC13)
CO2: 28 mEq/L (ref 22–29)
Calcium: 9 mg/dL (ref 8.4–10.4)
Glucose: 91 mg/dl (ref 70–140)
Sodium: 142 mEq/L (ref 136–145)
Total Bilirubin: 0.42 mg/dL (ref 0.20–1.20)
Total Protein: 8 g/dL (ref 6.4–8.3)

## 2013-08-10 LAB — CBC WITH DIFFERENTIAL/PLATELET
Eosinophils Absolute: 0.2 10*3/uL (ref 0.0–0.5)
HCT: 33.9 % — ABNORMAL LOW (ref 34.8–46.6)
LYMPH%: 31.5 % (ref 14.0–49.7)
MONO#: 0.4 10*3/uL (ref 0.1–0.9)
NEUT#: 2 10*3/uL (ref 1.5–6.5)
NEUT%: 51.2 % (ref 38.4–76.8)
Platelets: 179 10*3/uL (ref 145–400)
RBC: 3.7 10*6/uL (ref 3.70–5.45)
WBC: 4 10*3/uL (ref 3.9–10.3)

## 2013-08-10 MED ORDER — IOHEXOL 300 MG/ML  SOLN
100.0000 mL | Freq: Once | INTRAMUSCULAR | Status: AC | PRN
  Administered 2013-08-10: 100 mL via INTRAVENOUS

## 2013-08-12 ENCOUNTER — Encounter: Payer: Self-pay | Admitting: Physician Assistant

## 2013-08-12 ENCOUNTER — Telehealth: Payer: Self-pay | Admitting: Internal Medicine

## 2013-08-12 ENCOUNTER — Ambulatory Visit (HOSPITAL_BASED_OUTPATIENT_CLINIC_OR_DEPARTMENT_OTHER): Payer: PRIVATE HEALTH INSURANCE | Admitting: Physician Assistant

## 2013-08-12 VITALS — BP 154/79 | HR 72 | Temp 98.0°F | Resp 20 | Ht 62.0 in | Wt 220.4 lb

## 2013-08-12 DIAGNOSIS — C3490 Malignant neoplasm of unspecified part of unspecified bronchus or lung: Secondary | ICD-10-CM

## 2013-08-12 NOTE — Patient Instructions (Addendum)
Followup in 3 months with a restaging CT scan of the chest, abdomen and pelvis to reevaluate your disease.

## 2013-08-12 NOTE — Telephone Encounter (Signed)
Gave pt appt for lab and MD, December 2014 gave pt oral contrast for CT

## 2013-08-12 NOTE — Progress Notes (Addendum)
[facility] [phone]   Fax:([phone]) [phone]  OFFICE PROGRESS NOTE  Sanjuana Letters, MD [address] [place] [address]  DIAGNOSIS: Metastatic non-small cell lung cancer, adenocarcinoma with negative EGFR mutation and positive BRAF mutation, diagnosed in June of 2010.   PRIOR THERAPY:  1) Status post treatment with zelboraf 960 mg by mouth daily for less than 2 weeks at [facility] in [facility] in June of 2013 discontinued secondary to intolerance.  2) Systemic chemotherapy with carboplatin for AUC of 5 and Alimta 500 mg/M2 every 3 weeks. Status post 6 cycles.   CURRENT THERAPY: Observation.  INTERVAL HISTORY: Shannon Camacho 79 y.o. female returns to the clinic today for followup visit accompanied by her daughter. Patient reports that her primary care physician has discontinued her folic acid due to problems with constipation. She continues to have some mild issues with constipation. She occasionally has a cough that is "rattling" and occasionally productive of phlegm that has no color to it. She's had no problems with fever chills. She denies any significant shortness of breath, no chest pain, fever or chills. She denied any hemoptysis.  She had repeat CT scan of the chest, abdomen and pelvis for restaging of her disease and she is here today for discussion of her scan results.   MEDICAL HISTORY: Past Medical History  Diagnosis Date  . Hypertension   . lung ca dx'd 04/2009  . MI (myocardial infarction)   . Presbycusis 04/10/2013    Eval from [facility] done 5/14 shows typical presbycusis with bilateral high frequency hearing loss.     . OSTEOARTHRITIS 04/26/2009  . OBESITY 10/01/2007  . Insomnia 12/03/2012  . HYPOTHYROIDISM 04/26/2009    Qualifier: Diagnosis of  By: Vernie Murders    . HYPERTENSION, BENIGN 10/01/2007    Qualifier: Diagnosis of  By: Yetta Barre CNA/MA, Shanda Bumps    . HYPERLIPIDEMIA 04/26/2009     Qualifier: Diagnosis of  By: Vernie Murders    . Hyperglycemia 12/03/2012    A1C in 05/2012=6.4 on no meds   . Coronary artery disease 12/03/2012    Had acute mi 10/2010.  Had five stents placed in [place].     . Anemia 03/12/2013  . ADENOCARCINOMA, LUNG 05/20/2009    Has lung cancer.  Will be followed by cancer center.  Right now, no treatments, but will be reassessed soon at cancer center.    . Shortness of breath   . GERD (gastroesophageal reflux disease)     ALLERGIES:  is allergic to zelboraf.  MEDICATIONS:  Current Outpatient Prescriptions  Medication Sig Dispense Refill  . aspirin 81 MG chewable tablet Chew 162 mg by mouth once.      . cholecalciferol (VITAMIN D) 400 UNITS TABS Take 400 Units by mouth daily.      . ciclopirox (LOPROX) 0.77 % cream Apply 1 application topically daily as needed (for rash on feet). Uses for feet.  30 g  3  . diclofenac sodium (VOLTAREN) 1 % GEL Apply 4 g topically 4 (four) times daily as needed (for knee pain). Uses on knees      . furosemide (LASIX) 20 MG tablet Take 1 tablet (20 mg total) by mouth daily.  30 tablet  3  . levothyroxine (SYNTHROID, LEVOTHROID) 88 MCG tablet Take 1 tablet (88 mcg total) by mouth daily.  90 tablet  3  . metoprolol tartrate (LOPRESSOR) 25 MG tablet Take 1 tablet (25 mg total) by mouth 2 (  two) times daily.  180 tablet  3  . omeprazole (PRILOSEC) 40 MG capsule Take 1 capsule (40 mg total) by mouth daily.  90 capsule  3  . pravastatin (PRAVACHOL) 40 MG tablet Take 1.5 tablets (60 mg total) by mouth daily.  135 tablet  3  . traMADol (ULTRAM) 50 MG tablet Take 1 tablet (50 mg total) by mouth every 6 (six) hours as needed for pain.  120 tablet  5  . traZODone (DESYREL) 50 MG tablet Take 25-50 mg by mouth at bedtime as needed for sleep.       No current facility-administered medications for this visit.    REVIEW OF SYSTEMS:  A comprehensive review of systems was negative except for: Respiratory: positive for  cough Gastrointestinal: positive for constipation   PHYSICAL EXAMINATION: General appearance: alert, cooperative, fatigued and no distress Head: Normocephalic, without obvious abnormality, atraumatic Neck: no adenopathy Lymph nodes: Cervical, supraclavicular, and axillary nodes normal. Resp: clear to auscultation bilaterally Cardio: regular rate and rhythm, S1, S2 normal, no murmur, click, rub or gallop GI: soft, non-tender; bowel sounds normal; no masses,  no organomegaly Extremities: extremities normal, atraumatic, no cyanosis or edema Neurologic: Alert and oriented X 3, normal strength and tone. Normal symmetric reflexes. Normal coordination and gait  ECOG PERFORMANCE STATUS: 1 - Symptomatic but completely ambulatory  Blood pressure 154/79, pulse 72, temperature 98 F (36.7 C), temperature source Oral, resp. rate 20, height 5\' 2"  (1.575 m), weight 220 lb 6.4 oz (99.973 kg).  LABORATORY DATA: Lab Results  Component Value Date   WBC 4.0 08/10/2013   HGB 11.0* 08/10/2013   HCT 33.9* 08/10/2013   MCV 91.6 08/10/2013   PLT 179 08/10/2013      Chemistry      Component Value Date/Time   NA 142 08/10/2013 1008   NA 141 07/08/2013 1630   K 3.7 08/10/2013 1008   K 3.7 07/08/2013 1630   CL 106 07/08/2013 1630   CL 104 05/11/2013 0818   CO2 28 08/10/2013 1008   CO2 29 07/08/2013 1630   BUN 9.4 08/10/2013 1008   BUN 20 07/08/2013 1630   CREATININE 1.1 08/10/2013 1008   CREATININE 1.20* 07/08/2013 1630   CREATININE 1.10 05/16/2013 0615      Component Value Date/Time   CALCIUM 9.0 08/10/2013 1008   CALCIUM 9.4 07/08/2013 1630   ALKPHOS 81 08/10/2013 1008   ALKPHOS 80 07/08/2013 1630   AST 21 08/10/2013 1008   AST 25 07/08/2013 1630   ALT 13 08/10/2013 1008   ALT 16 07/08/2013 1630   BILITOT 0.42 08/10/2013 1008   BILITOT 0.3 07/08/2013 1630       RADIOGRAPHIC STUDIES:  Ct Chest W Contrast  08/10/2013   *RADIOLOGY REPORT*  Clinical Data:  Lung cancer with cough and shortness of breath. Diffuse  abdominal pain with constipation.  CT CHEST, ABDOMEN AND PELVIS WITH CONTRAST  Technique:  Multidetector CT imaging of the chest, abdomen and pelvis was performed following the standard protocol during bolus administration of intravenous contrast.  Contrast: OMNIPAQUE IOHEXOL 300 MG/ML  SOLN  Comparison:  05/08/2013  CT CHEST  Findings:  There is no axillary lymphadenopathy.  The right paratracheal lymph node which was 14 mm on the previous study is now 16 mm short axis.  11 mm AP window lymph node is stable at 11 mm.  15 mm short-axis subcarinal lymph node on today's study was 14 mm short-axis previously (mean measured).  The right suprahilar lesion is not  substantially changed, measuring 4.3 x 8.0 cm today compared to 4.7 x 7.5 cm previously.  Right hilar lymphadenopathy is unchanged.  No axillary lymphadenopathy.  The small prevascular lymph nodes are stable.  The heart size is normal. Coronary artery calcification is noted.  Trace pericardial effusion is new in the interval.  Interlobular septal thickening has progressed in the anterior right upper lung, suggesting central obstruction or lymphangitic tumor spread.  Tiny satellite nodules in the right upper lobe are stable.  The ground-glass nodule measured previously in the right lower lobe at 2.7 x 2.6 cm now measures 2.9 x 2.8 cm.  Other scattered small right lower lobe pulmonary nodules are stable.  The peripheral ground-glass opacity in the posterior left lower lobe measures 2.7 cm today compared to 1.9 cm previously.  Other scattered bilateral pulmonary nodules are evident with no definite new pulmonary nodule identified.  IMPRESSION: Mild increase and interlobular septal thickening in the anterior right apex with otherwise no substantial interval change exam.  The large right suprahilar mass is similar as are the larger ill- defined bilateral pulmonary parenchymal nodules.  Numerous scattered tiny pulmonary nodules persist and are unchanged.  CT  ABDOMEN AND PELVIS  Findings:  No focal abnormality is seen in the liver or spleen. The stomach, duodenum, pancreas, and right adrenal gland are unremarkable.  2.5 cm left adrenal nodule is unchanged.  Scattered areas of cortical scarring noted in both kidneys.  No abdominal aortic aneurysm.  No retroperitoneal lymphadenopathy. No evidence for mesenteric lymphadenopathy.  Imaging through the pelvis shows no free intraperitoneal fluid. There is no pelvic sidewall lymphadenopathy.  The bladder is normal.  Uterus is unremarkable.  No adnexal mass.  Diverticular changes are seen in the left colon without diverticulitis.  Terminal ileum is normal.  The appendix is normal.  Bone windows reveal no worrisome lytic or sclerotic osseous lesions.  IMPRESSION: Stable.  No new or progressive disease in the abdomen or pelvis.  No change in left adrenal nodule.   Original Report Authenticated By: Kennith Center, M.D.   Ct Abdomen Pelvis W Contrast  08/10/2013   *RADIOLOGY REPORT*  Clinical Data:  Lung cancer with cough and shortness of breath. Diffuse abdominal pain with constipation.  CT CHEST, ABDOMEN AND PELVIS WITH CONTRAST  Technique:  Multidetector CT imaging of the chest, abdomen and pelvis was performed following the standard protocol during bolus administration of intravenous contrast.  Contrast: OMNIPAQUE IOHEXOL 300 MG/ML  SOLN  Comparison:  05/08/2013  CT CHEST  Findings:  There is no axillary lymphadenopathy.  The right paratracheal lymph node which was 14 mm on the previous study is now 16 mm short axis.  11 mm AP window lymph node is stable at 11 mm.  15 mm short-axis subcarinal lymph node on today's study was 14 mm short-axis previously (mean measured).  The right suprahilar lesion is not substantially changed, measuring 4.3 x 8.0 cm today compared to 4.7 x 7.5 cm previously.  Right hilar lymphadenopathy is unchanged.  No axillary lymphadenopathy.  The small prevascular lymph nodes are stable.  The heart  size is normal. Coronary artery calcification is noted.  Trace pericardial effusion is new in the interval.  Interlobular septal thickening has progressed in the anterior right upper lung, suggesting central obstruction or lymphangitic tumor spread.  Tiny satellite nodules in the right upper lobe are stable.  The ground-glass nodule measured previously in the right lower lobe at 2.7 x 2.6 cm now measures 2.9 x 2.8 cm.  Other scattered small right lower lobe pulmonary nodules are stable.  The peripheral ground-glass opacity in the posterior left lower lobe measures 2.7 cm today compared to 1.9 cm previously.  Other scattered bilateral pulmonary nodules are evident with no definite new pulmonary nodule identified.  IMPRESSION: Mild increase and interlobular septal thickening in the anterior right apex with otherwise no substantial interval change exam.  The large right suprahilar mass is similar as are the larger ill- defined bilateral pulmonary parenchymal nodules.  Numerous scattered tiny pulmonary nodules persist and are unchanged.  CT ABDOMEN AND PELVIS  Findings:  No focal abnormality is seen in the liver or spleen. The stomach, duodenum, pancreas, and right adrenal gland are unremarkable.  2.5 cm left adrenal nodule is unchanged.  Scattered areas of cortical scarring noted in both kidneys.  No abdominal aortic aneurysm.  No retroperitoneal lymphadenopathy. No evidence for mesenteric lymphadenopathy.  Imaging through the pelvis shows no free intraperitoneal fluid. There is no pelvic sidewall lymphadenopathy.  The bladder is normal.  Uterus is unremarkable.  No adnexal mass.  Diverticular changes are seen in the left colon without diverticulitis.  Terminal ileum is normal.  The appendix is normal.  Bone windows reveal no worrisome lytic or sclerotic osseous lesions.  IMPRESSION: Stable.  No new or progressive disease in the abdomen or pelvis.  No change in left adrenal nodule.   Original Report Authenticated By:  Kennith Center, M.D.   ASSESSMENT AND PLAN: This is a very pleasant 79 years old African American female with metastatic non-small cell lung cancer, adenocarcinoma status post 6 cycles of systemic chemotherapy with carboplatin and Alimta was no significant evidence for disease progression on his recent scan. Patient was discussed with also seen by Dr. Arbutus Ped. She will continue on observation and return in 3 months with repeat CBC differential, C. met and repeat CT of the chest, abdomen and pelvis with contrast to reevaluate her disease  Jerame Hedding E, PA-C    She was advised to call immediately if she has any concerning symptoms in the interval.  All questions were answered. The patient knows to call the clinic with any problems, questions or concerns. We can certainly see the patient much sooner if necessary.   ADDENDUM:  Hematology/Oncology Attending:  I have a face to face encounter with the patient during his visit. I recommended her care plan. The patient has metastatic non-small cell lung cancer and completed 6 cycles of induction chemotherapy with carboplatin and Alimta with stable disease. She is currently on observation as she refused maintenance chemotherapy. The patient is doing fine today with no specific complaints. The recent CT scan of the chest, abdomen and pelvis showed no evidence for disease progression. I discussed the scan results with the patient and her daughter recommended for her to continue on observation with repeat blood work and CT scan of the chest, abdomen and pelvis in 3 months. The patient was advised to call immediately if she has any concerning symptoms in the interval. Lajuana Matte., MD 08/16/2013

## 2013-10-21 ENCOUNTER — Ambulatory Visit: Payer: PRIVATE HEALTH INSURANCE | Admitting: Family Medicine

## 2013-11-05 ENCOUNTER — Encounter: Payer: Self-pay | Admitting: Internal Medicine

## 2013-11-05 ENCOUNTER — Telehealth: Payer: Self-pay | Admitting: Licensed Clinical Social Worker

## 2013-11-05 NOTE — Progress Notes (Signed)
Ct chest Berkley Harvey [id number] ab/pelvis [id number] @ [facility] 11/09/13

## 2013-11-05 NOTE — Telephone Encounter (Signed)
Patient will need a Lipid panel drawn at next visit, [facility] medicare is requiring this. 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

## 2013-11-06 ENCOUNTER — Ambulatory Visit
Admission: RE | Admit: 2013-11-06 | Discharge: 2013-11-06 | Disposition: A | Discharge status: Home / Self Care | Payer: PRIVATE HEALTH INSURANCE | Source: Ambulatory Visit | Attending: Family Medicine | Admitting: Family Medicine

## 2013-11-06 ENCOUNTER — Ambulatory Visit (INDEPENDENT_AMBULATORY_CARE_PROVIDER_SITE_OTHER): Payer: PRIVATE HEALTH INSURANCE | Admitting: Family Medicine

## 2013-11-06 ENCOUNTER — Encounter: Payer: Self-pay | Admitting: Family Medicine

## 2013-11-06 VITALS — BP 119/57 | HR 69 | Temp 98.0°F | Ht 62.0 in | Wt 215.8 lb

## 2013-11-06 DIAGNOSIS — M79671 Pain in right foot: Secondary | ICD-10-CM

## 2013-11-06 DIAGNOSIS — I10 Essential (primary) hypertension: Secondary | ICD-10-CM

## 2013-11-06 DIAGNOSIS — E785 Hyperlipidemia, unspecified: Secondary | ICD-10-CM

## 2013-11-06 DIAGNOSIS — M79609 Pain in unspecified limb: Secondary | ICD-10-CM

## 2013-11-06 DIAGNOSIS — Z23 Encounter for immunization: Secondary | ICD-10-CM

## 2013-11-06 DIAGNOSIS — D649 Anemia, unspecified: Secondary | ICD-10-CM

## 2013-11-06 LAB — LIPID PANEL
Cholesterol: 157 mg/dL (ref 0–200)
Triglycerides: 57 mg/dL (ref <150)
VLDL: 11 mg/dL (ref 0–40)

## 2013-11-06 LAB — CBC
HCT: 32.1 % — ABNORMAL LOW (ref 36.0–46.0)
Hemoglobin: 10.1 g/dL — ABNORMAL LOW (ref 12.0–15.0)
MCH: 28.9 pg (ref 26.0–34.0)
MCHC: 31.5 g/dL (ref 30.0–36.0)
MCV: 91.7 fL (ref 78.0–100.0)

## 2013-11-06 NOTE — Assessment & Plan Note (Signed)
Perhaps overtreated.  Wait to make changes until I am sure of meds she is currently taking.

## 2013-11-06 NOTE — Assessment & Plan Note (Addendum)
Check, consider switch to atorvastatin.    Given great lipid panel, will decrease pravastatin to 40 mg daily for cost reasons.

## 2013-11-06 NOTE — Patient Instructions (Signed)
I will call Monday with lab results. When I call - remind me of four things. 1. How will we change her cholesterol medicine. 2. Review the list of medicines when you get home.  Tell me what you are and are not taking. 3. Depending on the blood count, we'll see about the iron 4. I may want to decrease her blood pressure medications. I will also call with the x ray results. You got a flu shot today. Merry Christmas

## 2013-11-06 NOTE — Assessment & Plan Note (Signed)
Recheck Hgb now off iron.

## 2013-11-06 NOTE — Assessment & Plan Note (Signed)
Regular x ray to check for metallic foreign body

## 2013-11-06 NOTE — Progress Notes (Signed)
   Subjective:    Patient ID: Shannon Camacho, female    DOB: 1928-09-18, 79 y.o.   MRN: [id number]  HPISeveral issues 1. Needs lipid panel.  Insurance company does not want to pay 1.5 per day. 2. Light headed spells.  BP lowish today. 3. Worried that a needle broke off in her right heel.  Feels pain, no skin changes 4. Not on iron since ended chemo.  Needs checked. 5. Needs letter from me for life alert. 6. Did not bring meds.  It sounds like she is not taking everything I have on the list. 7. Needs flu shot  Call daughter, Annice Pih, [phone] with lab results     Review of Systems     Objective:   Physical Exam Lungs clear Cardiac RRR with 1/6 SEM Foot right heel OK, no visible        Assessment & Plan:

## 2013-11-09 ENCOUNTER — Telehealth: Payer: Self-pay | Admitting: Family Medicine

## 2013-11-09 ENCOUNTER — Other Ambulatory Visit (HOSPITAL_BASED_OUTPATIENT_CLINIC_OR_DEPARTMENT_OTHER): Payer: PRIVATE HEALTH INSURANCE

## 2013-11-09 ENCOUNTER — Ambulatory Visit (HOSPITAL_COMMUNITY)
Admission: RE | Admit: 2013-11-09 | Discharge: 2013-11-09 | Disposition: A | Discharge status: Home / Self Care | Payer: PRIVATE HEALTH INSURANCE | Source: Ambulatory Visit | Attending: Physician Assistant | Admitting: Physician Assistant

## 2013-11-09 ENCOUNTER — Encounter: Payer: Self-pay | Admitting: Family Medicine

## 2013-11-09 DIAGNOSIS — R918 Other nonspecific abnormal finding of lung field: Secondary | ICD-10-CM | POA: Diagnosis not present

## 2013-11-09 DIAGNOSIS — C3490 Malignant neoplasm of unspecified part of unspecified bronchus or lung: Secondary | ICD-10-CM

## 2013-11-09 DIAGNOSIS — K838 Other specified diseases of biliary tract: Secondary | ICD-10-CM | POA: Diagnosis not present

## 2013-11-09 DIAGNOSIS — I319 Disease of pericardium, unspecified: Secondary | ICD-10-CM | POA: Insufficient documentation

## 2013-11-09 DIAGNOSIS — E278 Other specified disorders of adrenal gland: Secondary | ICD-10-CM | POA: Insufficient documentation

## 2013-11-09 DIAGNOSIS — R599 Enlarged lymph nodes, unspecified: Secondary | ICD-10-CM | POA: Diagnosis not present

## 2013-11-09 DIAGNOSIS — I2510 Atherosclerotic heart disease of native coronary artery without angina pectoris: Secondary | ICD-10-CM | POA: Diagnosis not present

## 2013-11-09 DIAGNOSIS — Z9089 Acquired absence of other organs: Secondary | ICD-10-CM | POA: Insufficient documentation

## 2013-11-09 DIAGNOSIS — C3410 Malignant neoplasm of upper lobe, unspecified bronchus or lung: Secondary | ICD-10-CM

## 2013-11-09 LAB — COMPREHENSIVE METABOLIC PANEL (CC13)
ALT: 10 U/L (ref 0–55)
Alkaline Phosphatase: 95 U/L (ref 40–150)
Creatinine: 1.3 mg/dL — ABNORMAL HIGH (ref 0.6–1.1)
Sodium: 141 mEq/L (ref 136–145)
Total Bilirubin: 0.29 mg/dL (ref 0.20–1.20)
Total Protein: 7.8 g/dL (ref 6.4–8.3)

## 2013-11-09 LAB — CBC WITH DIFFERENTIAL/PLATELET
BASO%: 0.4 % (ref 0.0–2.0)
EOS%: 3.6 % (ref 0.0–7.0)
LYMPH%: 25.4 % (ref 14.0–49.7)
MCH: 28.9 pg (ref 25.1–34.0)
MCHC: 31.8 g/dL (ref 31.5–36.0)
MCV: 90.8 fL (ref 79.5–101.0)
MONO%: 12 % (ref 0.0–14.0)
Platelets: 238 10*3/uL (ref 145–400)
RBC: 3.46 10*6/uL — ABNORMAL LOW (ref 3.70–5.45)
WBC: 5.2 10*3/uL (ref 3.9–10.3)

## 2013-11-09 MED ORDER — PRAVASTATIN SODIUM 40 MG PO TABS: 40.0000 mg | ORAL_TABLET | Freq: Every day | ORAL | Status: DC

## 2013-11-09 MED ORDER — IOHEXOL 300 MG/ML  SOLN
100.0000 mL | Freq: Once | INTRAMUSCULAR | Status: AC | PRN
  Administered 2013-11-09: 100 mL via INTRAVENOUS

## 2013-11-09 NOTE — Progress Notes (Signed)
Patient ID: Shannon Camacho, female   DOB: [birth year], 79 y.o.   MRN: [id number] Called and LM with daughter, Annice Pih.  Cholesterol great.  Hgb has drifted down some.  Has trouble tolerating iron.  Suggested she try every other day.

## 2013-11-09 NOTE — Telephone Encounter (Signed)
Daughter calls returning Dr. Leveda Anna call pertaining to test results.

## 2013-11-09 NOTE — Addendum Note (Signed)
Addended by: Moses Manners on: 11/09/2013 02:40 PM   Modules accepted: Orders

## 2013-11-09 NOTE — Telephone Encounter (Signed)
Called and discussed

## 2013-11-11 ENCOUNTER — Encounter: Payer: Self-pay | Admitting: Internal Medicine

## 2013-11-11 ENCOUNTER — Encounter (HOSPITAL_COMMUNITY): Payer: Self-pay | Admitting: Emergency Medicine

## 2013-11-11 ENCOUNTER — Emergency Department (HOSPITAL_COMMUNITY)
Admission: EM | Admit: 2013-11-11 | Discharge: 2013-11-12 | Disposition: A | Discharge status: Home / Self Care | Payer: PRIVATE HEALTH INSURANCE | Attending: Emergency Medicine | Admitting: Emergency Medicine

## 2013-11-11 ENCOUNTER — Other Ambulatory Visit: Payer: Self-pay

## 2013-11-11 ENCOUNTER — Telehealth: Payer: Self-pay | Admitting: Internal Medicine

## 2013-11-11 ENCOUNTER — Ambulatory Visit (HOSPITAL_BASED_OUTPATIENT_CLINIC_OR_DEPARTMENT_OTHER): Payer: PRIVATE HEALTH INSURANCE | Admitting: Internal Medicine

## 2013-11-11 VITALS — BP 126/56 | HR 79 | Temp 99.3°F | Resp 18 | Ht 62.0 in | Wt 217.0 lb

## 2013-11-11 DIAGNOSIS — E785 Hyperlipidemia, unspecified: Secondary | ICD-10-CM | POA: Insufficient documentation

## 2013-11-11 DIAGNOSIS — Z79899 Other long term (current) drug therapy: Secondary | ICD-10-CM | POA: Insufficient documentation

## 2013-11-11 DIAGNOSIS — I252 Old myocardial infarction: Secondary | ICD-10-CM | POA: Insufficient documentation

## 2013-11-11 DIAGNOSIS — R079 Chest pain, unspecified: Secondary | ICD-10-CM

## 2013-11-11 DIAGNOSIS — C3490 Malignant neoplasm of unspecified part of unspecified bronchus or lung: Secondary | ICD-10-CM

## 2013-11-11 DIAGNOSIS — Z862 Personal history of diseases of the blood and blood-forming organs and certain disorders involving the immune mechanism: Secondary | ICD-10-CM | POA: Insufficient documentation

## 2013-11-11 DIAGNOSIS — Z7982 Long term (current) use of aspirin: Secondary | ICD-10-CM | POA: Insufficient documentation

## 2013-11-11 DIAGNOSIS — Z87891 Personal history of nicotine dependence: Secondary | ICD-10-CM | POA: Insufficient documentation

## 2013-11-11 DIAGNOSIS — I2510 Atherosclerotic heart disease of native coronary artery without angina pectoris: Secondary | ICD-10-CM | POA: Insufficient documentation

## 2013-11-11 DIAGNOSIS — Z85118 Personal history of other malignant neoplasm of bronchus and lung: Secondary | ICD-10-CM | POA: Insufficient documentation

## 2013-11-11 DIAGNOSIS — R0789 Other chest pain: Secondary | ICD-10-CM | POA: Insufficient documentation

## 2013-11-11 DIAGNOSIS — Z8669 Personal history of other diseases of the nervous system and sense organs: Secondary | ICD-10-CM | POA: Insufficient documentation

## 2013-11-11 DIAGNOSIS — Z9861 Coronary angioplasty status: Secondary | ICD-10-CM | POA: Insufficient documentation

## 2013-11-11 DIAGNOSIS — M1990 Unspecified osteoarthritis, unspecified site: Secondary | ICD-10-CM | POA: Insufficient documentation

## 2013-11-11 DIAGNOSIS — I10 Essential (primary) hypertension: Secondary | ICD-10-CM | POA: Insufficient documentation

## 2013-11-11 DIAGNOSIS — K219 Gastro-esophageal reflux disease without esophagitis: Secondary | ICD-10-CM | POA: Insufficient documentation

## 2013-11-11 DIAGNOSIS — E669 Obesity, unspecified: Secondary | ICD-10-CM | POA: Insufficient documentation

## 2013-11-11 LAB — BASIC METABOLIC PANEL
Calcium: 8.8 mg/dL (ref 8.4–10.5)
Creatinine, Ser: 1.25 mg/dL — ABNORMAL HIGH (ref 0.50–1.10)
GFR calc non Af Amer: 39 mL/min — ABNORMAL LOW (ref >90)
Potassium: 3.5 mEq/L (ref 3.5–5.1)
Sodium: 137 mEq/L (ref 135–145)

## 2013-11-11 LAB — POCT I-STAT TROPONIN I: Troponin i, poc: 0 ng/mL (ref 0.00–0.08)

## 2013-11-11 LAB — CBC
MCH: 29.3 pg (ref 26.0–34.0)
MCHC: 32.2 g/dL (ref 30.0–36.0)
MCV: 91.2 fL (ref 78.0–100.0)
Platelets: 227 10*3/uL (ref 150–400)
RBC: 3.17 MIL/uL — ABNORMAL LOW (ref 3.87–5.11)

## 2013-11-11 NOTE — ED Notes (Signed)
Pt c/o left sided intermittent rib pain under left breast x 1 month. Denies diaphoresis, nausea, vomiting, weakness, light headedness.

## 2013-11-11 NOTE — Telephone Encounter (Signed)
appts made per 12/17 POF AVS and CAL and barium given shh

## 2013-11-11 NOTE — Patient Instructions (Signed)
Follow up visit in 3 months with repeat CT scan of the chest, abdomen and pelvis 

## 2013-11-11 NOTE — Progress Notes (Signed)
[facility] [phone]   [phone]  OFFICE PROGRESS NOTE  Sanjuana Letters, MD [address] [place] [address]  DIAGNOSIS: Metastatic non-small cell lung cancer, adenocarcinoma with negative EGFR mutation and positive BRAF mutation, diagnosed in June of 2010.   PRIOR THERAPY:  1) Status post treatment with zelboraf 960 mg by mouth daily for less than 2 weeks at [facility] in [place] in June of 2013 discontinued secondary to intolerance.  2) Systemic chemotherapy with carboplatin for AUC of 5 and Alimta 500 mg/M2 every 3 weeks. Status post 6 cycles.   CURRENT THERAPY: Observation.  INTERVAL HISTORY: Shannon Camacho 79 y.o. female returns to the clinic today for followup visit accompanied by her daughter. The patient is feeling fine today except for mild fatigue and chest congestion. She denied having any significant chest pain, shortness of breath except with exertion, mild cough but no hemoptysis. She has no significant nausea or vomiting. No fever or chills. She had repeat CT scan of the chest, abdomen and pelvis for restaging of her disease and she is here today for discussion of her scan results and recommendation regarding her condition.  MEDICAL HISTORY: Past Medical History  Diagnosis Date  . Hypertension   . lung ca dx'd 04/2009  . MI (myocardial infarction)   . Presbycusis 04/10/2013    Eval from [facility] done 5/14 shows typical presbycusis with bilateral high frequency hearing loss.     . OSTEOARTHRITIS 04/26/2009  . OBESITY 10/01/2007  . Insomnia 12/03/2012  . HYPOTHYROIDISM 04/26/2009    Qualifier: Diagnosis of  By: Vernie Murders    . HYPERTENSION, BENIGN 10/01/2007    Qualifier: Diagnosis of  By: Yetta Barre CNA/MA, Shanda Bumps    . HYPERLIPIDEMIA 04/26/2009    Qualifier: Diagnosis of  By: Vernie Murders    . Hyperglycemia 12/03/2012    A1C in 05/2012=6.4 on no meds   . Coronary artery disease  12/03/2012    Had acute mi 10/2010.  Had five stents placed in [place].     . Anemia 03/12/2013  . ADENOCARCINOMA, LUNG 05/20/2009    Has lung cancer.  Will be followed by cancer center.  Right now, no treatments, but will be reassessed soon at cancer center.    . Shortness of breath   . GERD (gastroesophageal reflux disease)     ALLERGIES:  is allergic to zelboraf.  MEDICATIONS:  Current Outpatient Prescriptions  Medication Sig Dispense Refill  . aspirin 81 MG chewable tablet Chew 162 mg by mouth once.      . cholecalciferol (VITAMIN D) 400 UNITS TABS Take 400 Units by mouth daily.      . ciclopirox (LOPROX) 0.77 % cream Apply 1 application topically daily as needed (for rash on feet). Uses for feet.  30 g  3  . diclofenac sodium (VOLTAREN) 1 % GEL Apply 4 g topically 4 (four) times daily as needed (for knee pain). Uses on knees      . furosemide (LASIX) 20 MG tablet Take 1 tablet (20 mg total) by mouth daily.  30 tablet  3  . metoprolol tartrate (LOPRESSOR) 25 MG tablet Take 1 tablet (25 mg total) by mouth 2 (two) times daily.  180 tablet  3  . omeprazole (PRILOSEC) 40 MG capsule Take 1 capsule (40 mg total) by mouth daily.  90 capsule  3  . pravastatin (PRAVACHOL) 40 MG tablet Take 1 tablet (40 mg total) by mouth daily.  90 tablet  3  . traMADol (ULTRAM) 50 MG tablet Take 1 tablet (50 mg total) by mouth every 6 (six) hours as needed for pain.  120 tablet  5   No current facility-administered medications for this visit.    REVIEW OF SYSTEMS:  A comprehensive review of systems was negative except for: Constitutional: positive for fatigue Respiratory: positive for cough and dyspnea on exertion   PHYSICAL EXAMINATION: General appearance: alert, cooperative, fatigued and no distress Head: Normocephalic, without obvious abnormality, atraumatic Neck: no adenopathy Lymph nodes: Cervical, supraclavicular, and axillary nodes normal. Resp: clear to auscultation bilaterally Cardio: regular  rate and rhythm, S1, S2 normal, no murmur, click, rub or gallop GI: soft, non-tender; bowel sounds normal; no masses,  no organomegaly Extremities: extremities normal, atraumatic, no cyanosis or edema Neurologic: Alert and oriented X 3, normal strength and tone. Normal symmetric reflexes. Normal coordination and gait  ECOG PERFORMANCE STATUS: 2 - Symptomatic, <50% confined to bed  Blood pressure 126/56, pulse 79, temperature 99.3 F (37.4 C), temperature source Oral, resp. rate 18, height 5\' 2"  (1.575 m), weight 217 lb (98.431 kg).  LABORATORY DATA: Lab Results  Component Value Date   WBC 5.2 11/09/2013   HGB 10.0* 11/09/2013   HCT 31.4* 11/09/2013   MCV 90.8 11/09/2013   PLT 238 11/09/2013      Chemistry      Component Value Date/Time   NA 141 11/09/2013 1025   NA 141 07/08/2013 1630   K 4.3 11/09/2013 1025   K 3.7 07/08/2013 1630   CL 106 07/08/2013 1630   CL 104 05/11/2013 0818   CO2 24 11/09/2013 1025   CO2 29 07/08/2013 1630   BUN 17.5 11/09/2013 1025   BUN 20 07/08/2013 1630   CREATININE 1.3* 11/09/2013 1025   CREATININE 1.20* 07/08/2013 1630   CREATININE 1.10 05/16/2013 0615      Component Value Date/Time   CALCIUM 9.6 11/09/2013 1025   CALCIUM 9.4 07/08/2013 1630   ALKPHOS 95 11/09/2013 1025   ALKPHOS 80 07/08/2013 1630   AST 16 11/09/2013 1025   AST 25 07/08/2013 1630   ALT 10 11/09/2013 1025   ALT 16 07/08/2013 1630   BILITOT 0.29 11/09/2013 1025   BILITOT 0.3 07/08/2013 1630       RADIOGRAPHIC STUDIES: Ct Chest W Contrast  11/09/2013   CLINICAL DATA:  Lung cancer.  Restaging small cell lung cancer.  EXAM: CT CHEST, ABDOMEN, AND PELVIS WITH CONTRAST  TECHNIQUE: Multidetector CT imaging of the chest, abdomen and pelvis was performed following the standard protocol during bolus administration of intravenous contrast.  CONTRAST:  OMNIPAQUE IOHEXOL 300 MG/ML  SOLN  COMPARISON:  CT CHEST W/CM dated 08/10/2013; NM PET IMAGE RESTAG (PS) SKULL BASE TO THIGH dated  05/25/2009  FINDINGS: CT CHEST FINDINGS  No axillary or supraclavicular lymphadenopathy. A 13 mm right lower paratracheal lymph node decreased from 15 mm on prior. There is a small pericardial effusion present. Coronary calcifications are present.  The large irregular right suprahilar mass measures 83 x 42 mm compared to 80 mm x 43 mm on prior for no significant change. Multiple bilateral pulmonary nodules many of which have irregular margins are again noted. . Index nodule at the left lung base measures 30 mm (images 44, series 5) compared to 30 mm on prior remeasured. Right lower lobe nodule measures 10 mm (image 23) compared to 10 mm on prior. Right middle lobe lesion measures larger 11 mm compared to 6 mm on prior.  Left lower lobe lesion measures 8 mm compared to 5 mm on prior.  CT ABDOMEN AND PELVIS FINDINGS  No focal hepatic lesion. Mild intrahepatic biliary duct dilatation following cholecystectomy. . The pancreas, spleen, and right adrenal gland are normal. There is a nodule extending from the left adrenal gland measuring 23 mm which is unchanged from size from recent CT. Lesions increased from PET-CT scan of 2010 which time the lesion measured 17 mm. Kidneys are normal.  The stomach, small bowel, and cecum are normal. The colon and rectosigmoid colon are normal.  Abdominal aorta is normal caliber. No retroperitoneal periportal lymphadenopathy. No peritoneal metastasis.  No free fluid the pelvis. The uterus and ovaries are normal. The bladder is normal. No aggressive osseous lesion.  IMPRESSION: 1. Dominant right suprahilar mass is not decreased in size compared to prior. 2. Several of the smaller bilateral pulmonary nodules have increased in size slightly. This concerning for progression of pulmonary metastasis. Other larger pulmonary nodules appear stable in size. 3. Stable mild mediastinal lymphadenopathy. 4. Slowly enlarging left adrenal nodule. Differential includes enlarging adenoma versus a collision  tumor. No change from most recent CT scan.   Electronically Signed   By: Genevive Bi M.D.   On: 11/09/2013 13:31   Dg Foot Complete Right  11/06/2013   CLINICAL DATA:  Right heel pain.  EXAM: RIGHT FOOT COMPLETE - 3+ VIEW  COMPARISON:  None.  FINDINGS: No fracture or dislocation is noted. Spurring of posterior calcaneus is noted. Joint spaces are intact. No radiopaque foreign body or other soft tissue abnormality is noted.  IMPRESSION: Mild spurring of posterior calcaneus. No other significant abnormality seen in the right foot.   Electronically Signed   By: Roque Lias M.D.   On: 11/06/2013 12:04    ASSESSMENT AND PLAN: This is a very pleasant 79 years old [place] American female with metastatic non-small cell lung cancer, adenocarcinoma status post 6 cycles of systemic chemotherapy with carboplatin and Alimta with no significant evidence for disease progression on his recent scan. I discussed the scan results with the patient and her daughter. I recommended for her to continue on observation for now as she does not have any significant evidence for disease progression but slight increase in some of the pulmonary nodules. I would see her back for followup visit in 3 months with repeat CT scan of the chest, abdomen and pelvis for restaging of her disease. She was advised to call immediately if she has any concerning symptoms in the interval.  All questions were answered. The patient knows to call the clinic with any problems, questions or concerns. We can certainly see the patient much sooner if necessary.

## 2013-11-11 NOTE — Telephone Encounter (Signed)
moved lab on 03/16 to 8am to coincide w CT appt sh[facility]

## 2013-11-12 ENCOUNTER — Encounter: Payer: Self-pay | Admitting: Family Medicine

## 2013-11-12 ENCOUNTER — Telehealth: Payer: Self-pay

## 2013-11-12 MED ORDER — HYDROCODONE-ACETAMINOPHEN 5-325 MG PO TABS
1.0000 | ORAL_TABLET | Freq: Once | ORAL | Status: AC
  Administered 2013-11-12: 1 via ORAL
  Filled 2013-11-12: qty 1

## 2013-11-12 MED ORDER — HYDROCODONE-ACETAMINOPHEN 5-325 MG PO TABS
1.0000 | ORAL_TABLET | ORAL | Status: DC | PRN
Start: 2013-11-12 — End: 2013-12-11

## 2013-11-12 NOTE — Telephone Encounter (Signed)
Daughter called to let us know pt went to ED last night with increased pain in L chest. New Rx for vicodin given and pt dc home. F/u with Dr Arbutus Ped is 3/17. This information passed to Dr Arbutus Ped.

## 2013-11-12 NOTE — ED Provider Notes (Signed)
CSN: [id number]     Arrival date & time 11/11/13  2020 History   First MD Initiated Contact with Patient 11/11/13 2342     Chief Complaint  Patient presents with  . Chest Pain    HPI  Patient presents with concern ongoing left chest pain.  Pain has been present for one month.  Pain is intermittent, severe when it is occurring.  Pain is focally about the left inframammary area.  Pain does not migrate, is not radiating. Patient has been taking ibuprofen, Tylenol with minimal relief. No new dyspnea, fever, chills. No nausea, vomiting, diarrhea. Patient has a notable history of metastatic adenocarcinoma of the lung.  She stopped therapy several months ago due to intolerance of the medication. She has no desire to restart therapy.   Past Medical History  Diagnosis Date  . Hypertension   . lung ca dx'd 04/2009  . MI (myocardial infarction)   . Presbycusis 04/10/2013    Eval from [facility] done 5/14 shows typical presbycusis with bilateral high frequency hearing loss.     . OSTEOARTHRITIS 04/26/2009  . OBESITY 10/01/2007  . Insomnia 12/03/2012  . HYPOTHYROIDISM 04/26/2009    Qualifier: Diagnosis of  By: Vernie Murders    . HYPERTENSION, BENIGN 10/01/2007    Qualifier: Diagnosis of  By: Yetta Barre CNA/MA, Shanda Bumps    . HYPERLIPIDEMIA 04/26/2009    Qualifier: Diagnosis of  By: Vernie Murders    . Hyperglycemia 12/03/2012    A1C in 05/2012=6.4 on no meds   . Coronary artery disease 12/03/2012    Had acute mi 10/2010.  Had five stents placed in [place].     . Anemia 03/12/2013  . ADENOCARCINOMA, LUNG 05/20/2009    Has lung cancer.  Will be followed by cancer center.  Right now, no treatments, but will be reassessed soon at cancer center.    . Shortness of breath   . GERD (gastroesophageal reflux disease)    Past Surgical History  Procedure Laterality Date  . Coronary stent placement    . Knee arthroplasty    . Cholecystectomy    . Esophagogastroduodenoscopy N/A 05/15/2013    Procedure:  ESOPHAGOGASTRODUODENOSCOPY (EGD);  Surgeon: Shirley Friar, MD;  Location: [facility] ENDOSCOPY;  Service: Endoscopy;  Laterality: N/A;  no c- arm per dr. Bosie Clos  . Savory dilation N/A 05/15/2013    Procedure: SAVORY DILATION;  Surgeon: Shirley Friar, MD;  Location: [facility] ENDOSCOPY;  Service: Endoscopy;  Laterality: N/A;   No family history on file. History  Substance Use Topics  . Smoking status: Former Smoker    Quit date: 12/03/1973  . Smokeless tobacco: Never Used  . Alcohol Use: No   OB History   Grav Para Term Preterm Abortions TAB SAB Ect Mult Living                 Review of Systems  Constitutional:       Per HPI, otherwise negative  HENT:       Per HPI, otherwise negative  Respiratory:       Per HPI, otherwise negative  Cardiovascular:       Per HPI, otherwise negative  Gastrointestinal: Negative for vomiting.  Endocrine:       Negative aside from HPI  Genitourinary:       Neg aside from HPI   Musculoskeletal:       Per HPI, otherwise negative  Skin: Negative.   Neurological: Negative for syncope.    Allergies  Zelboraf  Home Medications  Current Outpatient Rx  Name  Route  Sig  Dispense  Refill  . aspirin 81 MG chewable tablet   Oral   Chew 81 mg by mouth daily.          . cholecalciferol (VITAMIN D) 400 UNITS TABS   Oral   Take 400 Units by mouth daily.         . ciclopirox (LOPROX) 0.77 % cream   Topical   Apply 1 application topically daily as needed (for rash on feet). Uses for feet.   30 g   3   . diclofenac sodium (VOLTAREN) 1 % GEL   Topical   Apply 4 g topically 4 (four) times daily as needed (for knee pain). Uses on knees         . furosemide (LASIX) 20 MG tablet   Oral   Take 1 tablet (20 mg total) by mouth daily.   30 tablet   3   . metoprolol tartrate (LOPRESSOR) 25 MG tablet   Oral   Take 1 tablet (25 mg total) by mouth 2 (two) times daily.   180 tablet   3   . omeprazole (PRILOSEC) 40 MG capsule   Oral   Take  1 capsule (40 mg total) by mouth daily.   90 capsule   3   . pravastatin (PRAVACHOL) 40 MG tablet   Oral   Take 1 tablet (40 mg total) by mouth daily.   90 tablet   3   . traMADol (ULTRAM) 50 MG tablet   Oral   Take 1 tablet (50 mg total) by mouth every 6 (six) hours as needed for pain.   120 tablet   5   . HYDROcodone-acetaminophen (NORCO/VICODIN) 5-325 MG per tablet   Oral   Take 1 tablet by mouth every 4 (four) hours as needed for moderate pain.   20 tablet   0    BP 133/76  Pulse 78  Temp(Src) 99.2 F (37.3 C) (Oral)  Resp 18  SpO2 95% Physical Exam  Nursing note and vitals reviewed. Constitutional: She is oriented to person, place, and time. She appears well-developed and well-nourished. No distress.  HENT:  Head: Normocephalic and atraumatic.  Eyes: Conjunctivae and EOM are normal.  Cardiovascular: Normal rate and regular rhythm.   Pulmonary/Chest: Effort normal and breath sounds normal. No stridor. No respiratory distress.    Abdominal: She exhibits no distension.  Musculoskeletal: She exhibits no edema.  Neurological: She is alert and oriented to person, place, and time. No cranial nerve deficit.  Skin: Skin is warm and dry.  Psychiatric: She has a normal mood and affect.    ED Course  Procedures (including critical care time) Labs Review Labs Reviewed  CBC - Abnormal; Notable for the following:    RBC 3.17 (*)    Hemoglobin 9.3 (*)    HCT 28.9 (*)    All other components within normal limits  BASIC METABOLIC PANEL - Abnormal; Notable for the following:    Glucose, Bld 100 (*)    Creatinine, Ser 1.25 (*)    GFR calc non Af Amer 39 (*)    GFR calc Af Amer 45 (*)    All other components within normal limits  POCT I-STAT TROPONIN I   Imaging Review No results found.  EKG Interpretation   None     ECG:  SR, 86, pvc - abn  After the initial evaluation that demonstrated the CT images from today's outpatient study to the patient  and her  daughter. We discussed the need for additional pain medication, the demonstration of cancerous findings.  MDM   1. Chest pain    This patient presents with concern of ongoing left-sided chest pain.  Notably, the patient has previously identified metastatic cancer, imaged today, with demonstration of increasing size of multiple lesions.  Patient is medically stable, with no notable EKG changes, reassuring labs suggesting that her pain is indeed likely to 2 cancer.  I had a conversation with her and her daughter as we reviewed the CT images.  This was discharged in stable condition with no analgesia, primary care, oncology followup.    Gerhard Munch, MD 11/12/13 [phone]

## 2013-11-13 ENCOUNTER — Encounter: Payer: Self-pay | Admitting: Family Medicine

## 2013-11-13 MED ORDER — OMEPRAZOLE 40 MG PO CPDR: 40.0000 mg | DELAYED_RELEASE_CAPSULE | Freq: Every day | ORAL | Status: DC

## 2013-11-18 ENCOUNTER — Telehealth: Payer: Self-pay | Admitting: *Deleted

## 2013-11-18 NOTE — Telephone Encounter (Signed)
Called and informed patient's Daughter Adela Lank) that patient needs to increase po fluids.  Per Tiana Loft, PA.  Patient's daughter verbalized understanding.

## 2013-11-18 NOTE — Telephone Encounter (Signed)
Message copied by Raphael Gibney on Wed Nov 18, 2013  2:02 PM ------      Message from: Conni Slipper      Created: Wed Nov 18, 2013  2:00 PM       Abnormal results, please call and notify patient to increase her po fluids ------

## 2013-11-19 ENCOUNTER — Other Ambulatory Visit: Payer: Self-pay | Admitting: Family Medicine

## 2013-12-04 ENCOUNTER — Telehealth: Payer: Self-pay | Admitting: Family Medicine

## 2013-12-04 NOTE — Telephone Encounter (Signed)
Daughter called and wanted Dr. Andria Frames to know that her mother fell out of the bed. She was not hurt but thought you needed to know since they are to get life alert for her mother. Blima Rich

## 2013-12-04 NOTE — Telephone Encounter (Signed)
Noted  

## 2013-12-11 ENCOUNTER — Ambulatory Visit (INDEPENDENT_AMBULATORY_CARE_PROVIDER_SITE_OTHER): Payer: PRIVATE HEALTH INSURANCE | Admitting: Family Medicine

## 2013-12-11 ENCOUNTER — Encounter: Payer: Self-pay | Admitting: Family Medicine

## 2013-12-11 VITALS — BP 114/73 | HR 83 | Temp 97.8°F | Wt 214.5 lb

## 2013-12-11 DIAGNOSIS — Z515 Encounter for palliative care: Secondary | ICD-10-CM

## 2013-12-11 DIAGNOSIS — C3490 Malignant neoplasm of unspecified part of unspecified bronchus or lung: Secondary | ICD-10-CM

## 2013-12-11 DIAGNOSIS — E785 Hyperlipidemia, unspecified: Secondary | ICD-10-CM

## 2013-12-11 MED ORDER — AMOXICILLIN-POT CLAVULANATE 875-125 MG PO TABS: 1.0000 | ORAL_TABLET | Freq: Two times a day (BID) | ORAL | Status: AC

## 2013-12-11 MED ORDER — HYDROCODONE-HOMATROPINE 5-1.5 MG/5ML PO SYRP: 5.0000 mL | ORAL_SOLUTION | Freq: Three times a day (TID) | ORAL | Status: DC | PRN

## 2013-12-11 NOTE — Assessment & Plan Note (Signed)
DC pravastatin.  Long term not an issue.

## 2013-12-11 NOTE — Progress Notes (Signed)
   Subjective:    Patient ID: Shannon Camacho, female    DOB: August 07, 1928, 80 y.o.   MRN: [id number]  HPI  Cough x 2 months worsening.  No fever or chills.  Known lung ca. Does not want further chemo Some chest pain.  Also easy fatigue.  Sleeping poorly due to cough.     Review of Systems     Objective:   Physical Exam Crackles both left and right.  No wheeze.       Assessment & Plan:

## 2013-12-11 NOTE — Patient Instructions (Signed)
The narcotic containing cough syrup should help your cough regardless Let's see if the antibiotic helps. Shannon Camacho, my nurse, will set up the referral to hospice. See me in 2-4 weeks You can stop your pravastatin.

## 2013-12-11 NOTE — Assessment & Plan Note (Addendum)
Likely causing cough and chest pain.  Trial of augmentin to see if she has some post obstructive pneumonia contributing to cough.

## 2013-12-11 NOTE — Assessment & Plan Note (Signed)
Hospice referral.  DC pravastatin and ASA - I am not worried about preventing MI at this point.  Hycodan for cough/symptom relief

## 2013-12-13 ENCOUNTER — Encounter: Payer: Self-pay | Admitting: Internal Medicine

## 2013-12-18 ENCOUNTER — Encounter: Payer: Self-pay | Admitting: Family Medicine

## 2013-12-22 ENCOUNTER — Telehealth: Payer: Self-pay | Admitting: *Deleted

## 2013-12-22 ENCOUNTER — Telehealth: Payer: Self-pay | Admitting: Family Medicine

## 2013-12-22 NOTE — Telephone Encounter (Signed)
Evette from [facility] wants to confirm that Dr. Andria Frames will be the attending physician for the pt and if he would like management help from the Hospice's physicians.  Please give her a call at [phone].  Derl Barrow, RN

## 2013-12-22 NOTE — Telephone Encounter (Signed)
Shannon Camacho from [place] is needing know if Dr. Andria Frames will be the attending physician for patient, and if he would like Hospice MD to handle symptom management? Please call her at [phone]

## 2013-12-22 NOTE — Telephone Encounter (Signed)
Please advise. Shannon Camacho  

## 2013-12-22 NOTE — Telephone Encounter (Signed)
Yes, and Hospice notified.

## 2013-12-23 ENCOUNTER — Encounter: Payer: Self-pay | Admitting: Family Medicine

## 2013-12-29 ENCOUNTER — Telehealth: Payer: Self-pay | Admitting: Family Medicine

## 2013-12-29 DIAGNOSIS — C3490 Malignant neoplasm of unspecified part of unspecified bronchus or lung: Secondary | ICD-10-CM

## 2013-12-29 NOTE — Telephone Encounter (Signed)
Home Health nurse called to inform Dr. Andria Frames that the tramadol 50 mg she is taking for her fibromyalgia is not working like it used too. She wanted to know if she could increase the dosage or should she be put on something else. She is taking the Tramadol every 6 hours. jw

## 2013-12-31 MED ORDER — HYDROCODONE-ACETAMINOPHEN 5-325 MG PO TABS: 1.0000 | ORAL_TABLET | Freq: Three times a day (TID) | ORAL | Status: DC

## 2013-12-31 NOTE — Telephone Encounter (Signed)
Called daughter.  Tramadol not controlling pain.  Now on hospice so not concerned about long term narcotics  Will switch to hydrocodone.

## 2014-01-01 ENCOUNTER — Ambulatory Visit: Payer: PRIVATE HEALTH INSURANCE | Admitting: Family Medicine

## 2014-01-06 ENCOUNTER — Telehealth: Payer: Self-pay | Admitting: Internal Medicine

## 2014-01-06 NOTE — Telephone Encounter (Signed)
pt's dtr stopped by this week and requested that pt's appts be cx'd as pt has been moved to hospice. lm informing and also asked if i should proceed w/cancelling appts.

## 2014-01-07 ENCOUNTER — Telehealth: Payer: Self-pay | Admitting: Family Medicine

## 2014-01-07 NOTE — Telephone Encounter (Signed)
Noted and agree. 

## 2014-01-07 NOTE — Telephone Encounter (Signed)
Arbie Cookey from Hospice called to report patient Oxygen Sat has been dropping, so she has placed a standing order of 2 Liters nasal cannula PRN for the home. FYI

## 2014-01-08 ENCOUNTER — Telehealth: Payer: Self-pay | Admitting: Internal Medicine

## 2014-01-08 NOTE — Telephone Encounter (Signed)
s.w. pt daughter to r/s appt due to MD on [place]....pt is now on hospice and all appts cx.

## 2014-01-25 ENCOUNTER — Telehealth: Payer: Self-pay | Admitting: Family Medicine

## 2014-01-25 NOTE — Telephone Encounter (Signed)
Please advise.Thank You .Shannon Camacho, Shannon Camacho

## 2014-01-25 NOTE — Telephone Encounter (Signed)
I do authorize this referral.  I have been working to get this accomplished for almost 3 months.  Please see my letter of 11/06/13

## 2014-01-25 NOTE — Telephone Encounter (Signed)
[employer] called and needs a referral called in on file for Shannon Camacho. She has it but there is nothing on file from Dr. Andria Frames. Please call them at [phone]. jw

## 2014-01-25 NOTE — Telephone Encounter (Signed)
I will forward to [place].  [place]

## 2014-01-26 ENCOUNTER — Encounter: Payer: Self-pay | Admitting: Family Medicine

## 2014-01-28 NOTE — Telephone Encounter (Signed)
Will route note to [employer] (new Pt. Coordinator) to schedule patient appt with Dr. Andria Frames and send new patient packet.  Burna Forts, BSN, RN-BC

## 2014-02-01 ENCOUNTER — Other Ambulatory Visit: Payer: Self-pay | Admitting: *Deleted

## 2014-02-01 DIAGNOSIS — C3490 Malignant neoplasm of unspecified part of unspecified bronchus or lung: Secondary | ICD-10-CM

## 2014-02-01 MED ORDER — HYDROCODONE-HOMATROPINE 5-1.5 MG/5ML PO SYRP: 5.0000 mL | ORAL_SOLUTION | Freq: Three times a day (TID) | ORAL | Status: DC | PRN

## 2014-02-01 NOTE — Telephone Encounter (Signed)
I have called the number at least 40 times today (with the help of the redial button.)  EVERY TIME the number is BUSY and I have been unable to get through.  My frustration continues with the poor customer response for [facility].

## 2014-02-01 NOTE — Telephone Encounter (Signed)
Lanelle Bal from [employer], inquiring the status of patient's Life Alert paperwork. Please call her back at [phone] ext [phone].

## 2014-02-02 ENCOUNTER — Other Ambulatory Visit: Payer: PRIVATE HEALTH INSURANCE

## 2014-02-08 ENCOUNTER — Other Ambulatory Visit: Payer: PRIVATE HEALTH INSURANCE

## 2014-02-08 ENCOUNTER — Ambulatory Visit (HOSPITAL_COMMUNITY): Payer: PRIVATE HEALTH INSURANCE

## 2014-02-09 ENCOUNTER — Ambulatory Visit: Payer: PRIVATE HEALTH INSURANCE | Admitting: Internal Medicine

## 2014-02-10 ENCOUNTER — Ambulatory Visit: Payer: PRIVATE HEALTH INSURANCE | Admitting: Internal Medicine

## 2014-02-19 ENCOUNTER — Telehealth: Payer: Self-pay | Admitting: *Deleted

## 2014-02-19 NOTE — Telephone Encounter (Signed)
Shannon Camacho Received from [place], [place] with Hospice.  She wanted to let Dr. Andria Frames know that patient, on her own, ordered a mail order liquid supplement of L-Carnitine.  She is taking two tablespoons once a day.  If there are any questions, we may call Arbie Cookey, [place] at [phone]

## 2014-03-10 ENCOUNTER — Telehealth: Payer: Self-pay | Admitting: Family Medicine

## 2014-03-10 DIAGNOSIS — C3490 Malignant neoplasm of unspecified part of unspecified bronchus or lung: Secondary | ICD-10-CM

## 2014-03-10 NOTE — Telephone Encounter (Signed)
Arbie Cookey called she is the home health nurse for Shannon Camacho. She is requesting a refill on hydrocodone 1.5 mg per 5 ml cough syrup and also to order some magic mouthwash since [name] is having a lot of mouth sores. If you have any questions please call Arbie Cookey at [phone]. jw

## 2014-03-11 MED ORDER — MAGIC MOUTHWASH: 5.0000 mL | Freq: Four times a day (QID) | ORAL | Status: AC

## 2014-03-11 MED ORDER — HYDROCODONE-HOMATROPINE 5-1.5 MG/5ML PO SYRP: 5.0000 mL | ORAL_SOLUTION | Freq: Three times a day (TID) | ORAL | Status: DC | PRN

## 2014-03-11 NOTE — Telephone Encounter (Signed)
Discussed with Arbie Cookey.  Will follow both her suggestions.

## 2014-03-19 ENCOUNTER — Telehealth: Payer: Self-pay | Admitting: Family Medicine

## 2014-03-19 DIAGNOSIS — C3490 Malignant neoplasm of unspecified part of unspecified bronchus or lung: Secondary | ICD-10-CM

## 2014-03-19 MED ORDER — HYDROCODONE-ACETAMINOPHEN 5-325 MG PO TABS: ORAL_TABLET | ORAL | Status: DC

## 2014-03-19 NOTE — Telephone Encounter (Signed)
Please advise.Thank you.[place]

## 2014-03-19 NOTE — Telephone Encounter (Signed)
I am happy to endorse this suggested change.  I have changed the medication frequency in the chart.  Do I need to call in a new prescription?

## 2014-03-19 NOTE — Telephone Encounter (Signed)
[facility]: Pt is having increased pain in chest and and back. Currently on vicodan-taking 1 tablet every 8 hours./ Would like orders to show 1 to 1 pills every 4-6 hours plese advise

## 2014-03-19 NOTE — Telephone Encounter (Signed)
Dr Andria Frames per Arbie Cookey from [facility], patient would need a new  Updated Rx.Thank you.[place]

## 2014-03-21 ENCOUNTER — Other Ambulatory Visit: Payer: Self-pay | Admitting: Family Medicine

## 2014-03-22 MED ORDER — HYDROCODONE-ACETAMINOPHEN 5-325 MG PO TABS: ORAL_TABLET | ORAL | Status: DC

## 2014-03-22 NOTE — Telephone Encounter (Signed)
Rx printed and signed and given to Nurse [place].

## 2014-03-22 NOTE — Telephone Encounter (Signed)
Rx faxed to [facility] to faxed since hospice patient per pharmacist.Shannon Camacho

## 2014-03-23 ENCOUNTER — Other Ambulatory Visit: Payer: Self-pay | Admitting: Family Medicine

## 2014-03-26 ENCOUNTER — Telehealth: Payer: Self-pay | Admitting: Family Medicine

## 2014-03-26 ENCOUNTER — Other Ambulatory Visit: Payer: Self-pay | Admitting: Family Medicine

## 2014-03-26 MED ORDER — TRAMADOL HCL 50 MG PO TABS: 50.0000 mg | ORAL_TABLET | Freq: Four times a day (QID) | ORAL | Status: AC

## 2014-03-26 NOTE — Telephone Encounter (Signed)
[facility] called for Shannon Camacho and they need a refill on her Tramadol 50 mg every 6 hours. Please send to [facility] [address]. jw

## 2014-03-26 NOTE — Telephone Encounter (Signed)
Apparently taking both tramadol and hydrocodone per hospice.  Refill called.

## 2014-03-31 ENCOUNTER — Other Ambulatory Visit: Payer: Self-pay | Admitting: *Deleted

## 2014-03-31 ENCOUNTER — Other Ambulatory Visit: Payer: Self-pay | Admitting: Family Medicine

## 2014-04-01 MED ORDER — FUROSEMIDE 20 MG PO TABS: ORAL_TABLET | ORAL | Status: AC

## 2014-04-06 ENCOUNTER — Telehealth: Payer: Self-pay | Admitting: Family Medicine

## 2014-04-06 DIAGNOSIS — C3490 Malignant neoplasm of unspecified part of unspecified bronchus or lung: Secondary | ICD-10-CM

## 2014-04-06 NOTE — Telephone Encounter (Signed)
Pt called and needs a refill hydrocodone-homatropine called in. jw

## 2014-04-07 MED ORDER — HYDROCODONE-HOMATROPINE 5-1.5 MG/5ML PO SYRP: 5.0000 mL | ORAL_SOLUTION | Freq: Three times a day (TID) | ORAL | Status: DC | PRN

## 2014-04-07 NOTE — Telephone Encounter (Signed)
Rx called in and I will have signed Rx sent to pharmacy.

## 2014-04-07 NOTE — Telephone Encounter (Signed)
Per Lenard Forth was faxed.[place]

## 2014-04-20 ENCOUNTER — Telehealth: Payer: Self-pay | Admitting: Family Medicine

## 2014-04-20 NOTE — Telephone Encounter (Signed)
Shannon Camacho called from hospice and wants to get orders for a DNR. They also have standing orders for lorazepam 0.5 mg to help her rest. Please call Shannon Camacho at [phone] with verbal orders. jw

## 2014-04-20 NOTE — Telephone Encounter (Signed)
Spoke with Arbie Cookey, gave verbal for DNR.

## 2014-04-20 NOTE — Telephone Encounter (Signed)
LVM for Shannon Camacho to call me back. Spoke with Dr. Andria Frames and it is ok to give verbal orders for the DNR and also standing order for medication is ok.

## 2014-04-27 ENCOUNTER — Telehealth: Payer: Self-pay | Admitting: Family Medicine

## 2014-04-27 DIAGNOSIS — C3490 Malignant neoplasm of unspecified part of unspecified bronchus or lung: Secondary | ICD-10-CM

## 2014-04-27 NOTE — Telephone Encounter (Signed)
Pt needs a refill on her Hydrocodone-homatropine sent to the [facility] on [address]. jw

## 2014-04-28 MED ORDER — HYDROCODONE-HOMATROPINE 5-1.5 MG/5ML PO SYRP: 5.0000 mL | ORAL_SOLUTION | Freq: Three times a day (TID) | ORAL | Status: DC | PRN

## 2014-04-28 NOTE — Telephone Encounter (Signed)
Because she is a hospice patient, we can fax the prescription for this schedule 2 substance.

## 2014-04-28 NOTE — Telephone Encounter (Signed)
Rx was faxed to [facility] and they were also informed.Shannon Camacho

## 2014-04-30 IMAGING — CT CT CHEST W/ CM
3 of 7 series · 15 of 36 positions shown, 18 images · IV contrast (OMNIPAQUE)
Comparison: CT chest, abdomen and pelvis 09/12/2009.

CT CHEST

CLINICAL DATA: History of lung cancer.  Cough.

CT CHEST, ABDOMEN AND PELVIS WITH CONTRAST
TECHNIQUE: Multidetector CT imaging of the chest, abdomen and
pelvis was performed following the standard protocol during bolus
administration of intravenous contrast.
Contrast: 100mL OMNIPAQUE IOHEXOL 300 MG/ML  SOLN

[Series 5: cap with st · axial · 0.73mm/px · z∈[-755,-275]mm · 10 of 118 slices shown, 13 images]
[im 11/118  mediastinal]
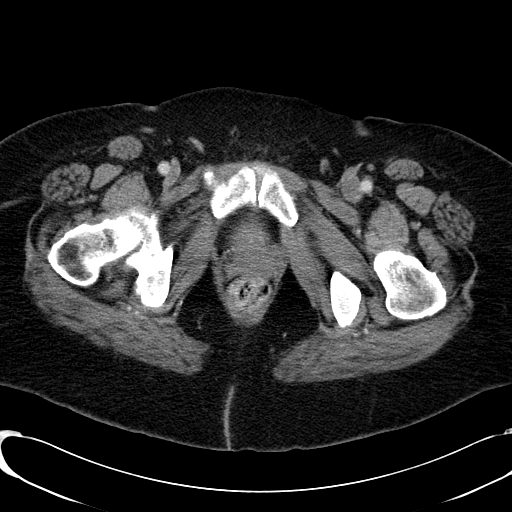
[im 11/118  lung]
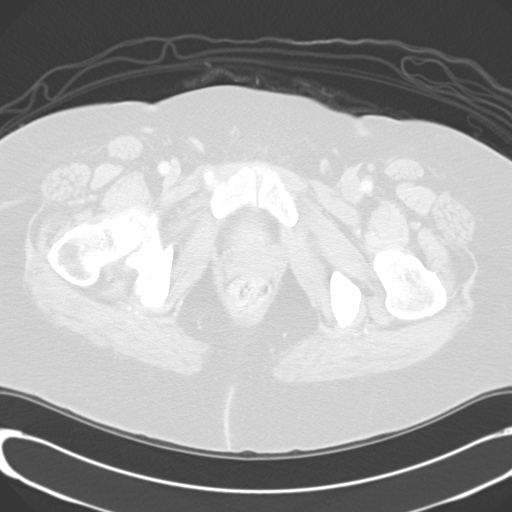
[im 22/118  lung]
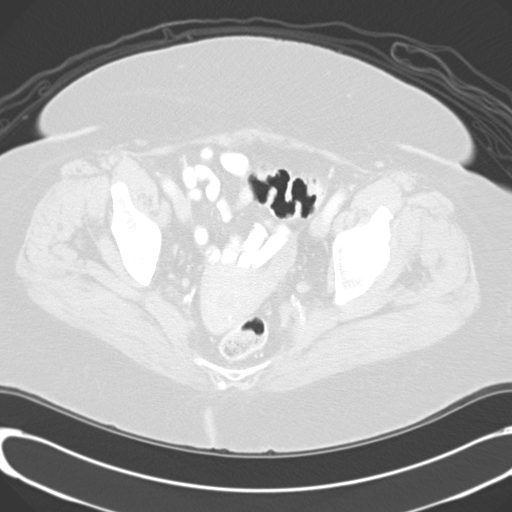
[im 32/118  lung]
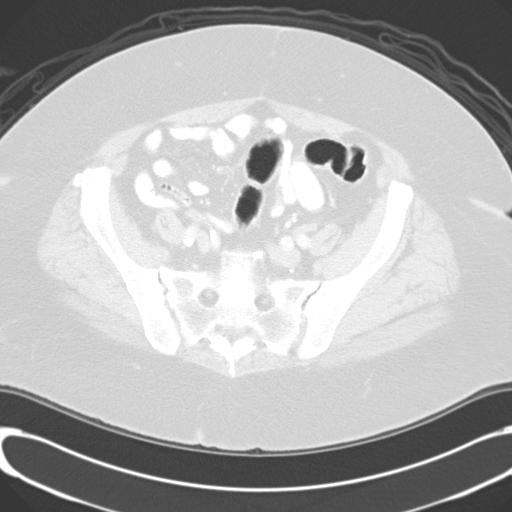
[im 43/118  lung]
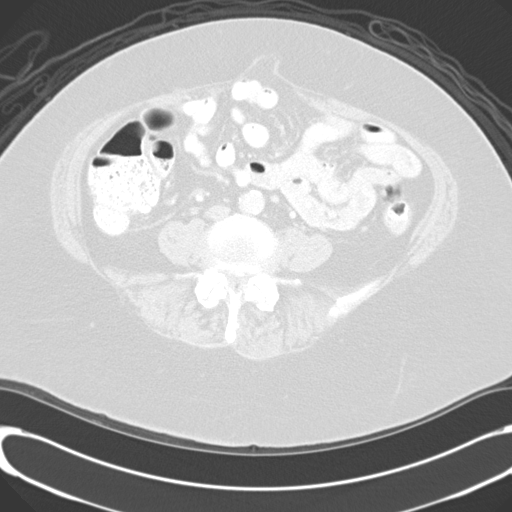
[im 54/118  mediastinal]
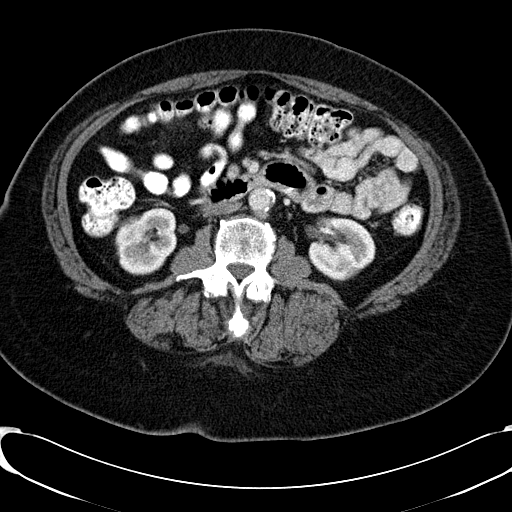
[im 54/118  lung]
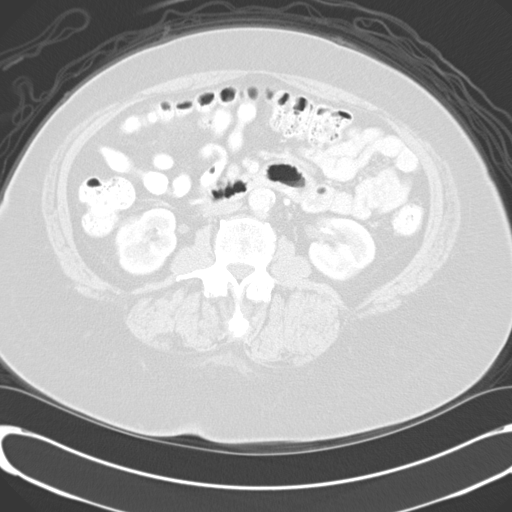
[im 64/118  lung]
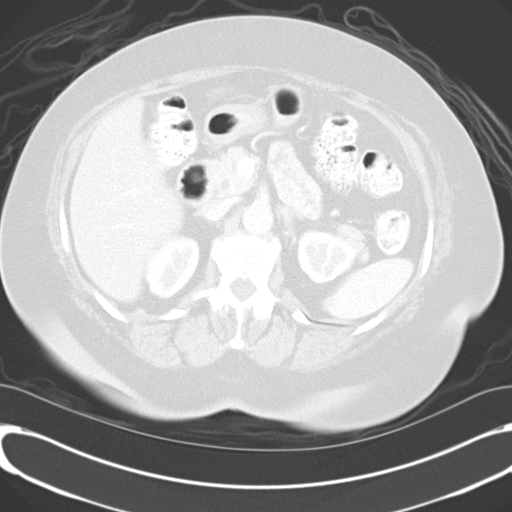
[im 75/118  lung]
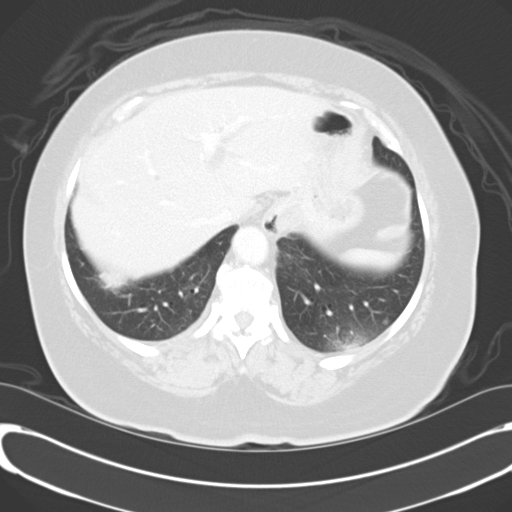
[im 86/118  lung]
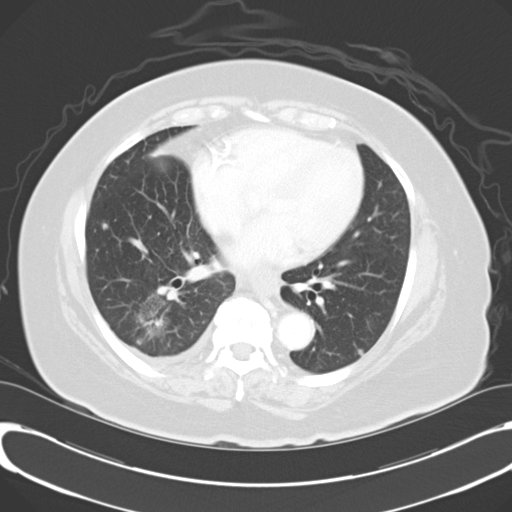
[im 96/118  mediastinal]
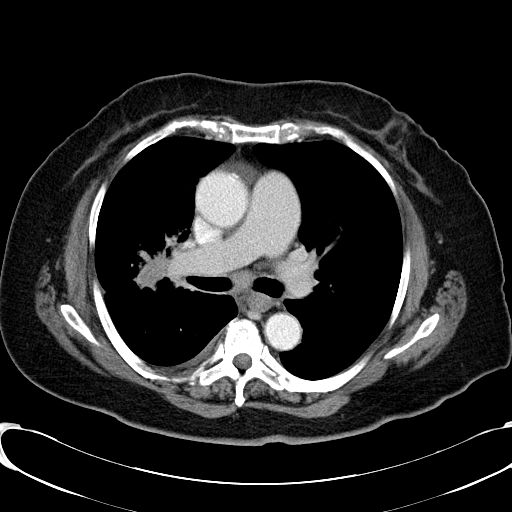
[im 96/118  lung]
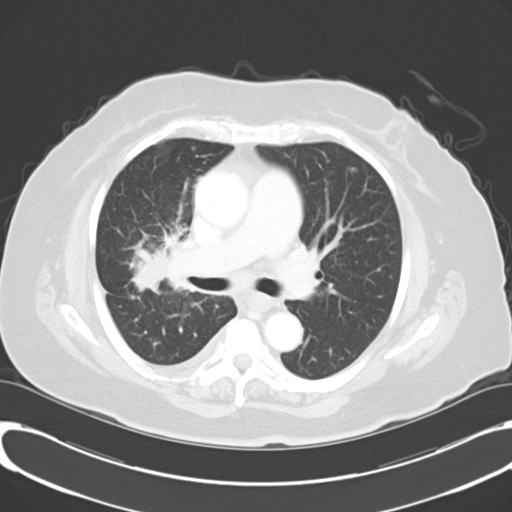
[im 107/118  lung]
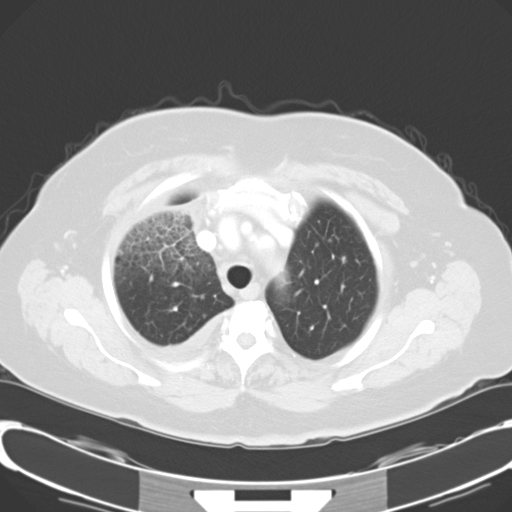

[Series 9: lung windows · axial · 0.73mm/px · z∈[-440,-385]mm · 2 of 56 slices shown]
[im 12/56  lung]
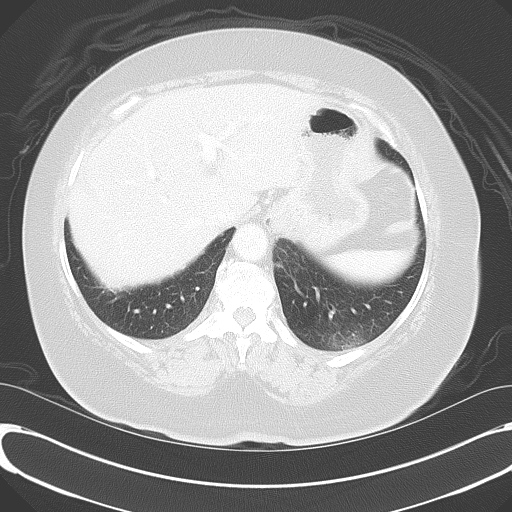
[im 23/56  lung]
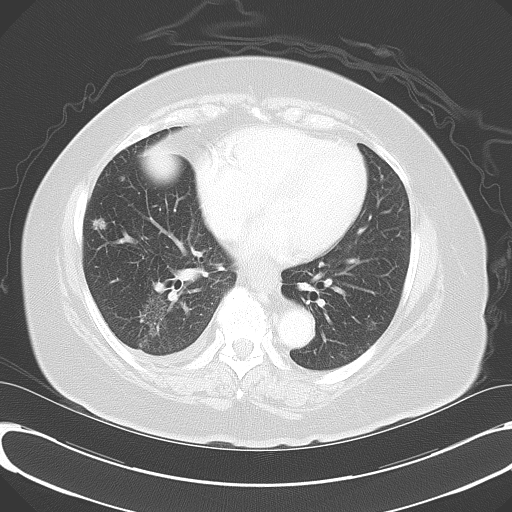

[Series 602: <mpr thick range> · coronal · 1.15mm/px · 3 of 86 slices shown]
[im 18/86  lung]
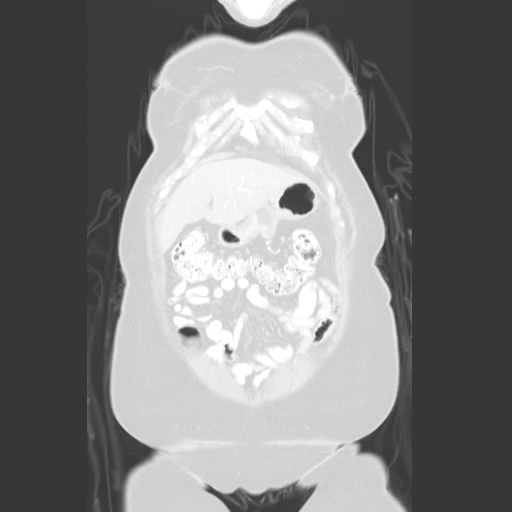
[im 35/86  lung]
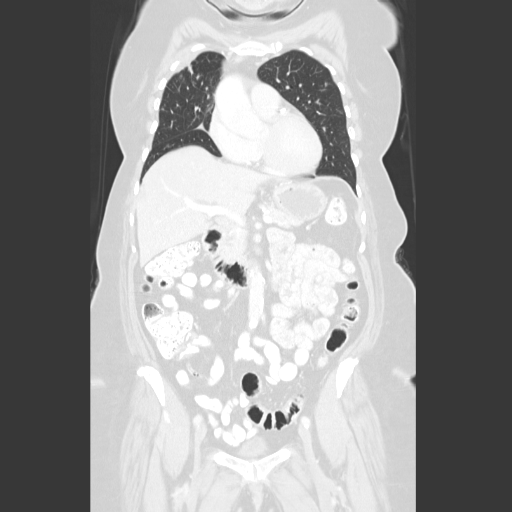
[im 52/86  lung]
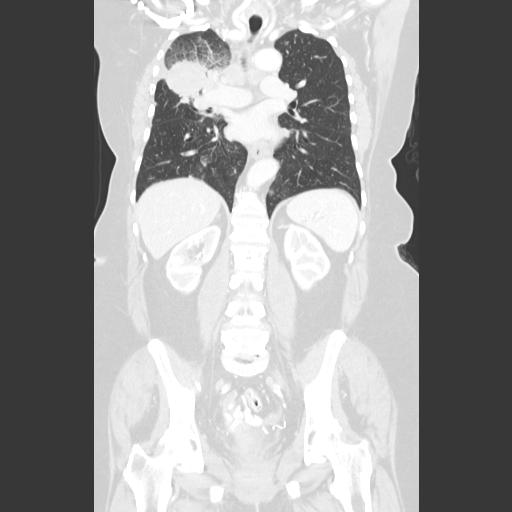

[15 of 36 positions shown; findings below may reference images not displayed]

FINDINGS: There is new lymphadenopathy in the chest.  Index right
paratracheal node which had measured 1 cm short axis dimension
today measures 1.7 cm on image 18.  Right precarinal node which had
measured 0.3 cm short axis dimension today measures 1.6 cm on image
19.  Subcarinal lymph node which had measured 0.9 cm short axis
dimension today measures 1.8 cm on image 24.  There is a new small
right pleural effusion.  No left pleural effusion or pericardial
effusion is identified.

Lungs demonstrate a large right upper lobe mass measuring 7.2 x
cm on image 19.  Extensive interlobular septal thickening in the
right upper lobe is compatible with lymphangitic tumor spread.  Two
new right upper lobe pulmonary nodules are identified on image 22
each measuring 0.4 the centimeters in diameter.  There are also new
pulmonary nodules on the left.  A left upper lobe nodule on image
30 measures 0.5 cm short axis dimension.  Left lower lobe nodule on
image 42 measures 1.7 x 1.1 cm.  Additionally, an index right lower
lobe nodule on image 36 measures 0.6 cm. No focal bony lesion is
identified.
IMPRESSION: Findings consistent with marked interval progression of lung
carcinoma with a large right upper lobe mass, lymphangitic tumor
spread in the right upper lobe, multiple pulmonary nodules and
lymphadenopathy as described above.

CT ABDOMEN AND PELVIS
FINDINGS: Left adrenal nodule compatible with a benign adenoma is
not markedly changed.  The right adrenal gland, spleen, pancreas
and liver are unremarkable.  The patient is status post
cholecystectomy.  Small low attenuating lesions in the left kidney
are unchanged in compatible with cysts.  The right kidney appears
normal.  Small fat containing umbilical hernia is identified.
There is no lymphadenopathy or fluid.  Uterus, adnexa and urinary
bladder appear normal.  The appendix, stomach and small and large
bowel appear normal.  No worrisome bony lesion is seen with some
degenerative change about the hips and lower lumbar spine noted.
IMPRESSION: Negative for metastatic or acute disease.

## 2014-05-13 ENCOUNTER — Telehealth: Payer: Self-pay | Admitting: Family Medicine

## 2014-05-13 DIAGNOSIS — J449 Chronic obstructive pulmonary disease, unspecified: Secondary | ICD-10-CM

## 2014-05-13 MED ORDER — ALBUTEROL SULFATE (2.5 MG/3ML) 0.083% IN NEBU: 2.5000 mg | INHALATION_SOLUTION | Freq: Four times a day (QID) | RESPIRATORY_TRACT | Status: AC | PRN

## 2014-05-13 NOTE — Telephone Encounter (Signed)
Arbie Cookey from [facility] called requesting a RX for a nebulizer for patient. Patient's cough has increases and the phlegm is becoming thicker. Please send to [facility]. Please call Arbie Cookey with any questions [phone]

## 2014-06-02 ENCOUNTER — Telehealth: Payer: Self-pay | Admitting: Family Medicine

## 2014-06-02 DIAGNOSIS — C3490 Malignant neoplasm of unspecified part of unspecified bronchus or lung: Secondary | ICD-10-CM

## 2014-06-02 NOTE — Telephone Encounter (Signed)
Refill request for Hydrocodone.  

## 2014-06-03 MED ORDER — HYDROCODONE-ACETAMINOPHEN 5-325 MG PO TABS: ORAL_TABLET | ORAL | Status: AC

## 2014-06-03 NOTE — Telephone Encounter (Signed)
Faxed out to pharmacy

## 2014-06-16 ENCOUNTER — Telehealth: Payer: Self-pay | Admitting: Family Medicine

## 2014-06-16 DIAGNOSIS — C3490 Malignant neoplasm of unspecified part of unspecified bronchus or lung: Secondary | ICD-10-CM

## 2014-06-16 NOTE — Telephone Encounter (Signed)
Arbie Cookey from [facility] calls requesting refill on [address]. Also, she states patient is taking pain meds more often and would like to know if there is an options to switch her to extended release/long acting meds instead of short acting. Please call Arbie Cookey at [phone].

## 2014-06-17 MED ORDER — HYDROCODONE-HOMATROPINE 5-1.5 MG/5ML PO SYRP: 5.0000 mL | ORAL_SOLUTION | Freq: Three times a day (TID) | ORAL | Status: AC | PRN

## 2014-06-17 MED ORDER — MORPHINE SULFATE ER 15 MG PO TBCR: 15.0000 mg | EXTENDED_RELEASE_TABLET | Freq: Two times a day (BID) | ORAL | Status: AC

## 2014-06-17 NOTE — Telephone Encounter (Signed)
Called and discussed with Arbie Cookey.  Will start MS contin.

## 2014-06-21 ENCOUNTER — Encounter: Payer: Self-pay | Admitting: Family Medicine

## 2014-06-21 NOTE — Progress Notes (Signed)
   Subjective:    Patient ID: Shannon Camacho, female    DOB: 1928/10/22, 80 y.o.   MRN: [id number]  HPI Informed that patient died over the WE.  Expected death under hospice care for metastatic lung ca.  Death certificate filled out listing lung cancer as cause of death.    Review of Systems     Objective:   Physical Exam        Assessment & Plan:

## 2014-06-26 DEATH — deceased

## 2014-07-23 ENCOUNTER — Other Ambulatory Visit: Payer: Self-pay | Admitting: Pharmacist
